# Patient Record
Sex: Female | Born: 1980 | Race: Black or African American | Hispanic: No | Marital: Married | Smoking: Former smoker
Health system: Southern US, Community
[De-identification: ages and names within clinical notes are randomized; demographics above are authoritative.]

## PROBLEM LIST (undated history)

## (undated) ENCOUNTER — Inpatient Hospital Stay (HOSPITAL_COMMUNITY): Payer: Self-pay

## (undated) DIAGNOSIS — D573 Sickle-cell trait: Secondary | ICD-10-CM

## (undated) DIAGNOSIS — N83209 Unspecified ovarian cyst, unspecified side: Secondary | ICD-10-CM

## (undated) HISTORY — PX: INDUCED ABORTION: SHX677

---
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
## 2005-01-29 ENCOUNTER — Inpatient Hospital Stay (HOSPITAL_COMMUNITY): Admission: AD | Admit: 2005-01-29 | Discharge: 2005-01-29 | Payer: Self-pay | Admitting: Obstetrics and Gynecology

## 2005-04-04 ENCOUNTER — Ambulatory Visit (HOSPITAL_COMMUNITY): Admission: RE | Admit: 2005-04-04 | Discharge: 2005-04-04 | Payer: Self-pay | Admitting: Obstetrics & Gynecology

## 2005-08-15 ENCOUNTER — Inpatient Hospital Stay (HOSPITAL_COMMUNITY): Admission: AD | Admit: 2005-08-15 | Discharge: 2005-08-17 | Payer: Self-pay | Admitting: Obstetrics & Gynecology

## 2006-12-02 ENCOUNTER — Emergency Department (HOSPITAL_COMMUNITY): Admission: EM | Admit: 2006-12-02 | Discharge: 2006-12-02 | Payer: Self-pay | Admitting: Family Medicine

## 2011-03-13 LAB — POCT URINALYSIS DIP (DEVICE)
Glucose, UA: NEGATIVE
Ketones, ur: 40 — AB
Nitrite: POSITIVE — AB
Operator id: 116391
Protein, ur: 100 — AB
Specific Gravity, Urine: 1.01
Urobilinogen, UA: 2 — ABNORMAL HIGH
pH: 6

## 2011-03-13 LAB — POCT PREGNANCY, URINE
Operator id: 235561
Preg Test, Ur: NEGATIVE

## 2011-03-13 LAB — URINE CULTURE: Colony Count: >=100000

## 2014-06-22 ENCOUNTER — Emergency Department (HOSPITAL_COMMUNITY)
Admission: EM | Admit: 2014-06-22 | Discharge: 2014-06-22 | Disposition: A | Discharge status: Home / Self Care | Payer: No Typology Code available for payment source | Attending: Emergency Medicine | Admitting: Emergency Medicine

## 2014-06-22 ENCOUNTER — Encounter (HOSPITAL_COMMUNITY): Payer: Self-pay | Admitting: Emergency Medicine

## 2014-06-22 DIAGNOSIS — Y92410 Unspecified street and highway as the place of occurrence of the external cause: Secondary | ICD-10-CM | POA: Diagnosis not present

## 2014-06-22 DIAGNOSIS — S199XXA Unspecified injury of neck, initial encounter: Secondary | ICD-10-CM | POA: Insufficient documentation

## 2014-06-22 DIAGNOSIS — Y998 Other external cause status: Secondary | ICD-10-CM | POA: Insufficient documentation

## 2014-06-22 DIAGNOSIS — M25552 Pain in left hip: Secondary | ICD-10-CM

## 2014-06-22 DIAGNOSIS — S79912A Unspecified injury of left hip, initial encounter: Secondary | ICD-10-CM | POA: Diagnosis present

## 2014-06-22 DIAGNOSIS — Z720 Tobacco use: Secondary | ICD-10-CM | POA: Diagnosis not present

## 2014-06-22 DIAGNOSIS — Z043 Encounter for examination and observation following other accident: Secondary | ICD-10-CM

## 2014-06-22 DIAGNOSIS — S0990XA Unspecified injury of head, initial encounter: Secondary | ICD-10-CM | POA: Insufficient documentation

## 2014-06-22 DIAGNOSIS — Z041 Encounter for examination and observation following transport accident: Secondary | ICD-10-CM

## 2014-06-22 DIAGNOSIS — Y9389 Activity, other specified: Secondary | ICD-10-CM | POA: Insufficient documentation

## 2014-06-22 MED ORDER — NAPROXEN 500 MG PO TABS: 500.0000 mg | ORAL_TABLET | Freq: Two times a day (BID) | ORAL | Status: DC

## 2014-06-22 MED ORDER — CYCLOBENZAPRINE HCL 5 MG PO TABS: 5.0000 mg | ORAL_TABLET | Freq: Three times a day (TID) | ORAL | Status: DC | PRN
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
## 2014-06-22 NOTE — Discharge Instructions (Signed)

## 2015-01-28 ENCOUNTER — Inpatient Hospital Stay (HOSPITAL_COMMUNITY): Payer: Medicaid Other

## 2015-01-28 ENCOUNTER — Encounter (HOSPITAL_COMMUNITY): Payer: Self-pay | Admitting: *Deleted

## 2015-01-28 ENCOUNTER — Inpatient Hospital Stay (HOSPITAL_COMMUNITY)
Admission: AD | Admit: 2015-01-28 | Discharge: 2015-01-28 | Disposition: A | Discharge status: Home / Self Care | Payer: Medicaid Other | Source: Ambulatory Visit | Attending: Obstetrics & Gynecology | Admitting: Obstetrics & Gynecology

## 2015-01-28 DIAGNOSIS — O210 Mild hyperemesis gravidarum: Secondary | ICD-10-CM | POA: Diagnosis not present

## 2015-01-28 DIAGNOSIS — O209 Hemorrhage in early pregnancy, unspecified: Secondary | ICD-10-CM | POA: Insufficient documentation

## 2015-01-28 DIAGNOSIS — O219 Vomiting of pregnancy, unspecified: Secondary | ICD-10-CM

## 2015-01-28 DIAGNOSIS — O4691 Antepartum hemorrhage, unspecified, first trimester: Secondary | ICD-10-CM

## 2015-01-28 DIAGNOSIS — Z3A09 9 weeks gestation of pregnancy: Secondary | ICD-10-CM | POA: Insufficient documentation

## 2015-01-28 DIAGNOSIS — Z87891 Personal history of nicotine dependence: Secondary | ICD-10-CM | POA: Insufficient documentation

## 2015-01-28 DIAGNOSIS — Z3491 Encounter for supervision of normal pregnancy, unspecified, first trimester: Secondary | ICD-10-CM

## 2015-01-28 HISTORY — DX: Unspecified ovarian cyst, unspecified side: N83.209

## 2015-01-28 HISTORY — DX: Sickle-cell trait: D57.3

## 2015-01-28 LAB — URINALYSIS, ROUTINE W REFLEX MICROSCOPIC
Bilirubin Urine: NEGATIVE
Glucose, UA: NEGATIVE mg/dL
Ketones, ur: NEGATIVE mg/dL
LEUKOCYTES UA: NEGATIVE
Nitrite: NEGATIVE
Protein, ur: NEGATIVE mg/dL
SPECIFIC GRAVITY, URINE: 1.01 (ref 1.005–1.030)
Urobilinogen, UA: 0.2 mg/dL (ref 0.0–1.0)
pH: 6.5 (ref 5.0–8.0)

## 2015-01-28 LAB — URINE MICROSCOPIC-ADD ON

## 2015-01-28 LAB — CBC
HCT: 32.1 % — ABNORMAL LOW (ref 36.0–46.0)
Hemoglobin: 11.4 g/dL — ABNORMAL LOW (ref 12.0–15.0)
MCH: 31 pg (ref 26.0–34.0)
MCHC: 35.5 g/dL (ref 30.0–36.0)
MCV: 87.2 fL (ref 78.0–100.0)
Platelets: 290 10*3/uL (ref 150–400)
RBC: 3.68 MIL/uL — ABNORMAL LOW (ref 3.87–5.11)
RDW: 13.7 % (ref 11.5–15.5)
WBC: 13.6 10*3/uL — ABNORMAL HIGH (ref 4.0–10.5)

## 2015-01-28 LAB — WET PREP, GENITAL
CLUE CELLS WET PREP: NONE SEEN
TRICH WET PREP: NONE SEEN
YEAST WET PREP: NONE SEEN

## 2015-01-28 LAB — ABO/RH: ABO/RH(D): A POS

## 2015-01-28 LAB — OB RESULTS CONSOLE GC/CHLAMYDIA: Gonorrhea: NEGATIVE

## 2015-01-28 LAB — POCT PREGNANCY, URINE: PREG TEST UR: POSITIVE — AB

## 2015-01-28 LAB — HCG, QUANTITATIVE, PREGNANCY: hCG, Beta Chain, Quant, S: 191957 m[IU]/mL — ABNORMAL HIGH (ref <5)

## 2015-01-28 MED ORDER — PROMETHAZINE HCL 25 MG PO TABS: 12.5000 mg | ORAL_TABLET | Freq: Four times a day (QID) | ORAL | Status: DC | PRN

## 2015-01-28 NOTE — MAU Provider Note (Signed)
Chief Complaint: Vaginal Bleeding   None     SUBJECTIVE HPI: [name] [name] is a 34 y.o. [id number] at [redacted]w[redacted]d by LMP who presents to maternity admissions reporting onset of bright red bleeding when wiping this afternoon. She reports constipation with some straining this morning but was only urinating when she first saw the bleeding. She is not wearing a pad but put toilet paper into her underwear and came straight to the hospital when she noticed the bleeding.  The blood has not soaked through to her clothes.  She reports nausea/vomiting daily but no other problems so far in this pregnancy.  She has applied for pregnancy Medicaid and is waiting for this to start prenatal care. She denies vaginal itching/burning, urinary symptoms, h/a, dizziness, n/v, or fever/chills.     Vaginal Bleeding The patient's primary symptoms include vaginal bleeding. The patient's pertinent negatives include no pelvic pain or vaginal discharge. This is a new problem. The current episode started today. The problem occurs constantly. The problem has been unchanged. The patient is experiencing no pain. She is pregnant. Associated symptoms include constipation, nausea and vomiting. Pertinent negatives include no abdominal pain, back pain, chills, diarrhea, dysuria, fever, flank pain, frequency, headaches or urgency. The vaginal bleeding is lighter than menses. She has not been passing clots. She has not been passing tissue. The symptoms are aggravated by bowel movements. She has tried nothing for the symptoms. She is sexually active. No, her partner does not have an STD. Her menstrual history has been regular.    Past Medical History  Diagnosis Date  . Sickle cell trait   . Ovarian cyst    Past Surgical History  Procedure Laterality Date  . Induced abortion     Social History   Social History  . Marital Status: Single    Spouse Name: N/A  . Number of Children: N/A  . Years of Education: N/A   Occupational History   . Not on file.   Social History Main Topics  . Smoking status: Former Smoker -- 0.50 packs/day    Types: Cigarettes    Quit date: 10/27/2014  . Smokeless tobacco: Not on file  . Alcohol Use: Yes     Comment: not since pregnancy  . Drug Use: No  . Sexual Activity: Not on file   Other Topics Concern  . Not on file   Social History Narrative   No current facility-administered medications on file prior to encounter.   No current outpatient prescriptions on file prior to encounter.   No Known Allergies  ROS:  Review of Systems  Constitutional: Negative for fever, chills and fatigue.  HENT: Negative for sinus pressure.   Eyes: Negative for photophobia.  Respiratory: Negative for shortness of breath.   Cardiovascular: Negative for chest pain.  Gastrointestinal: Positive for nausea, vomiting and constipation. Negative for abdominal pain and diarrhea.  Genitourinary: Positive for vaginal bleeding. Negative for dysuria, urgency, frequency, flank pain, vaginal discharge, difficulty urinating, vaginal pain and pelvic pain.  Musculoskeletal: Negative for back pain and neck pain.  Neurological: Negative for dizziness, weakness and headaches.  Psychiatric/Behavioral: Negative.      I have reviewed patient's Past Medical Hx, Surgical Hx, Family Hx, Social Hx, medications and allergies.   Physical Exam  No data found.  Constitutional: Well-developed, well-nourished female in no acute distress.  Cardiovascular: normal rate Respiratory: normal effort GI: Abd soft, non-tender. Pos BS x 4 MS: Extremities nontender, no edema, normal ROM Neurologic: Alert and oriented x 4.  GU: Neg CVAT.  PELVIC EXAM: Cervix pink, visually closed, without lesion, scant pink discharge, vaginal walls and external genitalia normal Bimanual exam: Cervix 0/long/high, firm, anterior, neg CMT, uterus nontender, nonenlarged, adnexa without tenderness, enlargement, or mass   LAB RESULTS No results found for  this or any previous visit (from the past 24 hour(s)).  --/--/A POS (09/02 1735)  IMAGING US Ob Comp Less 14 Wks  01/28/2015   CLINICAL DATA:  First-trimester bleeding  EXAM: OBSTETRIC <14 WK ULTRASOUND  TECHNIQUE: Transabdominal ultrasound was performed for evaluation of the gestation as well as the maternal uterus and adnexal regions.  COMPARISON:  None.  FINDINGS: Intrauterine gestational sac: Single  Yolk sac:  Yes  Embryo:  Yes  Cardiac Activity: Yes  Heart Rate: 173 bpm  MSD:   mm    w     d  CRL:   24.4  mm   9 w 1 d                  [place] EDC: 09/01/2015  Maternal uterus/adnexae: Small subchorionic hemorrhage. Unremarkable adnexal regions. No free pelvic fluid.  IMPRESSION: Single living intrauterine gestation measuring 9 weeks 1 day by crown-rump length.   Electronically Signed   By: Ellery Plunk M.D.   On: 01/28/2015 21:53    MAU Management/MDM: Ordered labs and reviewed results. Pt stable at time of discharge.  ASSESSMENT 1. Vaginal bleeding in pregnancy, first trimester   2. Normal IUP (intrauterine pregnancy) on prenatal ultrasound, first trimester   3. Nausea and vomiting during pregnancy prior to [redacted] weeks gestation     PLAN Discharge home with bleeding precautions Discussed US findings and [facility] with pt as likely normal finding.  Pt states understanding. Phenergan 12.5-25 mg PO Q 6 hours PRN nausea Increase fiber and PO fluids for constipation Return to MAU as needed for emergencies   Follow-up Information    Please follow up.   Why:  Start prenatal care as soon as possible      Sharen Counter Certified Nurse-Midwife 01/31/2015  8:47 PM

## 2015-01-28 NOTE — MAU Note (Signed)
Pos HPT x 3 the end of July, has been having N&V.  Has been constipated, strained some with BM this a.m.  Urinated this afternoon & saw blood with wiping.  Denies pain.

## 2015-01-28 NOTE — Discharge Instructions (Signed)
Nausea medication to take during pregnancy:   Unisom (doxylamine succinate 25 mg tablets) Take one tablet daily at bedtime. If symptoms are not adequately controlled, the dose can be increased to a maximum recommended dose of two tablets daily (1/2 tablet in the morning, 1/2 tablet mid-afternoon and one at bedtime).  Vitamin B6  tablets. Take one tablet twice a day (up to 200 mg per day).  Add Phenergan as prescribed to take as needed.   Vaginal Bleeding During Pregnancy, First Trimester A small amount of bleeding (spotting) from the vagina is relatively common in early pregnancy. It usually stops on its own. Various things may cause bleeding or spotting in early pregnancy. Some bleeding may be related to the pregnancy, and some may not. In most cases, the bleeding is normal and is not a problem. However, bleeding can also be a sign of something serious. Be sure to tell your health care provider about any vaginal bleeding right away. Some possible causes of vaginal bleeding during the first trimester include:  Infection or inflammation of the cervix.  Growths (polyps) on the cervix.  Miscarriage or threatened miscarriage.  Pregnancy tissue has developed outside of the uterus and in a fallopian tube (tubal pregnancy).  Tiny cysts have developed in the uterus instead of pregnancy tissue (molar pregnancy). HOME CARE INSTRUCTIONS  Watch your condition for any changes. The following actions may help to lessen any discomfort you are feeling:  Follow your health care provider's instructions for limiting your activity. If your health care provider orders bed rest, you may need to stay in bed and only get up to use the bathroom. However, your health care provider may allow you to continue light activity.  If needed, make plans for someone to help with your regular activities and responsibilities while you are on bed rest.  Keep track of the number of pads you use each day, how often you  change pads, and how soaked (saturated) they are. Write this down.  Do not use tampons. Do not douche.  Do not have sexual intercourse or orgasms until approved by your health care provider.  If you pass any tissue from your vagina, save the tissue so you can show it to your health care provider.  Only take over-the-counter or prescription medicines as directed by your health care provider.  Do not take aspirin because it can make you bleed.  Keep all follow-up appointments as directed by your health care provider. SEEK MEDICAL CARE IF:  You have any vaginal bleeding during any part of your pregnancy.  You have cramps or labor pains.  You have a fever, not controlled by medicine. SEEK IMMEDIATE MEDICAL CARE IF:   You have severe cramps in your back or belly (abdomen).  You pass large clots or tissue from your vagina.  Your bleeding increases.  You feel light-headed or weak, or you have fainting episodes.  You have chills.  You are leaking fluid or have a gush of fluid from your vagina.  You pass out while having a bowel movement. MAKE SURE YOU:  Understand these instructions.  Will watch your condition.  Will get help right away if you are not doing well or get worse. Document Released: 02/21/2005 Document Revised: 05/19/2013 Document Reviewed: 01/19/2013 [facility] Patient Information 2015 [place], [place]. This information is not intended to replace advice given to you by your health care provider. Make sure you discuss any questions you have with your health care provider. Prenatal Care Providers [facility]  [facility]  [phone]     Phone: [phone]          [facility]                      [facility]  @[place]     Phone: [phone]  Phone: [phone]         [facility]     Phone: [phone]  Phone: [phone]           [facility] [place]                hone: [phone]  Phone: [phone]         [facility] [address]o      Phone: [phone]  Phone: [phone]         [facility] Dept.                Phone: [phone]  [facility]   [address] [place])          Phone: [phone] [facility]   Phone: [phone]

## 2015-01-29 LAB — HIV ANTIBODY (ROUTINE TESTING W REFLEX): HIV Screen 4th Generation wRfx: NONREACTIVE

## 2015-02-01 LAB — GC/CHLAMYDIA PROBE AMP (~~LOC~~) NOT AT ARMC
Chlamydia: NEGATIVE
NEISSERIA GONORRHEA: NEGATIVE

## 2015-04-11 ENCOUNTER — Encounter: Payer: Self-pay | Admitting: Obstetrics & Gynecology

## 2015-04-11 ENCOUNTER — Other Ambulatory Visit (HOSPITAL_COMMUNITY)
Admission: RE | Admit: 2015-04-11 | Discharge: 2015-04-11 | Disposition: A | Discharge status: Home / Self Care | Payer: Medicaid Other | Source: Ambulatory Visit | Attending: Obstetrics & Gynecology | Admitting: Obstetrics & Gynecology

## 2015-04-11 ENCOUNTER — Ambulatory Visit (INDEPENDENT_AMBULATORY_CARE_PROVIDER_SITE_OTHER): Payer: Medicaid Other | Admitting: Obstetrics & Gynecology

## 2015-04-11 VITALS — BP 101/63 | HR 83 | Wt 130.0 lb

## 2015-04-11 DIAGNOSIS — O0942 Supervision of pregnancy with grand multiparity, second trimester: Secondary | ICD-10-CM

## 2015-04-11 DIAGNOSIS — Z1151 Encounter for screening for human papillomavirus (HPV): Secondary | ICD-10-CM | POA: Diagnosis not present

## 2015-04-11 DIAGNOSIS — K0889 Other specified disorders of teeth and supporting structures: Secondary | ICD-10-CM

## 2015-04-11 DIAGNOSIS — Z3482 Encounter for supervision of other normal pregnancy, second trimester: Secondary | ICD-10-CM

## 2015-04-11 DIAGNOSIS — Z113 Encounter for screening for infections with a predominantly sexual mode of transmission: Secondary | ICD-10-CM | POA: Diagnosis present

## 2015-04-11 DIAGNOSIS — Z01419 Encounter for gynecological examination (general) (routine) without abnormal findings: Secondary | ICD-10-CM | POA: Insufficient documentation

## 2015-04-11 DIAGNOSIS — Z36 Encounter for antenatal screening of mother: Secondary | ICD-10-CM | POA: Diagnosis not present

## 2015-04-11 DIAGNOSIS — O4702 False labor before 37 completed weeks of gestation, second trimester: Secondary | ICD-10-CM

## 2015-04-11 DIAGNOSIS — O0990 Supervision of high risk pregnancy, unspecified, unspecified trimester: Secondary | ICD-10-CM | POA: Insufficient documentation

## 2015-04-11 NOTE — Addendum Note (Signed)
Addended by: Gita KudoLASSITER, Shatha Hooser S on: 04/11/2015 03:59 PM   Modules accepted: Orders

## 2015-04-11 NOTE — Progress Notes (Signed)
   Subjective:    [name] [name] is a Jeannie FendM AA  [id number] 8951w4d being seen today for her first obstetrical visit.  Her obstetrical history is significant for grandmultipara. Patient does intend to breast feed. Pregnancy history fully reviewed.  Patient reports toothache.  Filed Vitals:   04/11/15 1514  BP: 101/63  Pulse: 83  Weight: 130 lb (58.968 kg)    HISTORY: OB History  Gravida Para Term Preterm AB SAB TAB Ectopic Multiple Living  6 4 4  1     4     # Outcome Date GA Lbr Len/2nd Weight Sex Delivery Anes PTL Lv  6 Current           5 Term 05/05/10   7 lb 8 oz (3.402 kg) F Vag-Spont     4 Term 08/15/05   7 lb 8 oz (3.402 kg) F Vag-Spont     3 Term 08/02/01   7 lb 13 oz (3.544 kg) F Vag-Spont     2 AB 05/2000          1 Term 08/03/99   6 lb 13 oz (3.09 kg) M Vag-Spont        Past Medical History  Diagnosis Date  . Sickle cell trait (HCC)   . Ovarian cyst    Past Surgical History  Procedure Laterality Date  . Induced abortion     Family History  Problem Relation Age of Onset  . Cancer Mother     Breast  . Diabetes Mother      Exam    Uterus:     Pelvic Exam:    Perineum: No Hemorrhoids   Vulva: normal   Vagina:  normal mucosa, normal discharge   pH:    Cervix: anteverted   Adnexa: normal adnexa   Bony Pelvis: android  System: Breast:  normal appearance, no masses or tenderness   Skin: normal coloration and turgor, no rashes    Neurologic: oriented   Extremities: normal strength, tone, and muscle mass   HEENT PERRLA   Mouth/Teeth mucous membranes moist, pharynx normal without lesions   Neck supple   Cardiovascular: regular rate and rhythm   Respiratory:  appears well, vitals normal, no respiratory distress, acyanotic, normal RR, ear and throat exam is normal, neck free of mass or lymphadenopathy, chest clear, no wheezing, crepitations, rhonchi, normal symmetric air entry   Abdomen: soft, non-tender; bowel sounds normal; no masses,  no organomegaly   Urinary: urethral meatus normal      Assessment:    Pregnancy: [id number] Patient Active Problem List   Diagnosis Date Noted  . Encounter for supervision of other normal pregnancy in second trimester 04/11/2015  . Grand multipara in labor in second trimester 04/11/2015        Plan:     Initial labs drawn. Prenatal vitamins. Problem list reviewed and updated. Genetic Screening discussed Quad Screen: declined.  Ultrasound discussed; fetal survey: ordered.  Follow up in 4 weeks. She declines a flu vaccine today.   Atiyana Welte C. 04/11/2015

## 2015-04-12 LAB — PRENATAL PROFILE (SOLSTAS)
Antibody Screen: NEGATIVE
BASOS PCT: 0 % (ref 0–1)
Basophils Absolute: 0 10*3/uL (ref 0.0–0.1)
Eosinophils Absolute: 0.2 10*3/uL (ref 0.0–0.7)
Eosinophils Relative: 1 % (ref 0–5)
HCT: 28.8 % — ABNORMAL LOW (ref 36.0–46.0)
HIV 1&2 Ab, 4th Generation: NONREACTIVE
Hemoglobin: 9.8 g/dL — ABNORMAL LOW (ref 12.0–15.0)
Hepatitis B Surface Ag: NEGATIVE
Lymphocytes Relative: 20 % (ref 12–46)
Lymphs Abs: 3.8 10*3/uL (ref 0.7–4.0)
MCH: 31.5 pg (ref 26.0–34.0)
MCHC: 34 g/dL (ref 30.0–36.0)
MCV: 92.6 fL (ref 78.0–100.0)
MPV: 9.1 fL (ref 8.6–12.4)
Monocytes Absolute: 1.5 10*3/uL — ABNORMAL HIGH (ref 0.1–1.0)
Monocytes Relative: 8 % (ref 3–12)
NEUTROS PCT: 71 % (ref 43–77)
Neutro Abs: 13.3 10*3/uL — ABNORMAL HIGH (ref 1.7–7.7)
Platelets: 307 10*3/uL (ref 150–400)
RBC: 3.11 MIL/uL — ABNORMAL LOW (ref 3.87–5.11)
RDW: 15 % (ref 11.5–15.5)
Rh Type: POSITIVE
Rubella: 17.2 Index — ABNORMAL HIGH (ref <0.90)
WBC: 18.8 10*3/uL — ABNORMAL HIGH (ref 4.0–10.5)

## 2015-04-12 LAB — CULTURE, OB URINE
Colony Count: NO GROWTH
Organism ID, Bacteria: NO GROWTH

## 2015-04-13 ENCOUNTER — Telehealth: Payer: Self-pay

## 2015-04-13 NOTE — Telephone Encounter (Signed)
Patient called and wanted her info sent to another dentist office, one that would take medicaid, [facility]

## 2015-04-14 LAB — CYTOLOGY - PAP

## 2015-04-15 ENCOUNTER — Ambulatory Visit (HOSPITAL_COMMUNITY)
Admission: RE | Admit: 2015-04-15 | Discharge: 2015-04-15 | Disposition: A | Discharge status: Home / Self Care | Payer: Medicaid Other | Source: Ambulatory Visit | Attending: Obstetrics & Gynecology | Admitting: Obstetrics & Gynecology

## 2015-04-15 DIAGNOSIS — Z3A20 20 weeks gestation of pregnancy: Secondary | ICD-10-CM | POA: Diagnosis not present

## 2015-04-15 DIAGNOSIS — Z36 Encounter for antenatal screening of mother: Secondary | ICD-10-CM | POA: Insufficient documentation

## 2015-04-15 DIAGNOSIS — Z3482 Encounter for supervision of other normal pregnancy, second trimester: Secondary | ICD-10-CM

## 2015-04-18 ENCOUNTER — Encounter: Payer: Self-pay | Admitting: *Deleted

## 2015-04-28 ENCOUNTER — Encounter: Payer: Self-pay | Admitting: *Deleted

## 2015-05-11 ENCOUNTER — Ambulatory Visit (INDEPENDENT_AMBULATORY_CARE_PROVIDER_SITE_OTHER): Payer: Medicaid Other | Admitting: Family Medicine

## 2015-05-11 VITALS — BP 99/64 | HR 82 | Wt 133.0 lb

## 2015-05-11 DIAGNOSIS — Z3492 Encounter for supervision of normal pregnancy, unspecified, second trimester: Secondary | ICD-10-CM

## 2015-05-11 DIAGNOSIS — Z3482 Encounter for supervision of other normal pregnancy, second trimester: Secondary | ICD-10-CM

## 2015-05-11 NOTE — Progress Notes (Signed)
Subjective:  [name] [name] is a 34 y.o. [phone] [id number] at 6880w6d being seen today for ongoing prenatal care.  She is currently monitored for the following issues for this low-risk pregnancy and has Supervision of normal pregnancy, antepartum on her problem list.  Patient reports no complaints.  Contractions: Not present. Vag. Bleeding: None.  Movement: Present. Denies leaking of fluid.   The following portions of the patient's history were reviewed and updated as appropriate: allergies, current medications, past family history, past medical history, past social history, past surgical history and problem list. Problem list updated.  Objective:   Filed Vitals:   05/11/15 1021  BP: 99/64  Pulse: 82  Weight: 133 lb (60.328 kg)    Fetal Status: Fetal Heart Rate (bpm): 161   Movement: Present     General:  Alert, oriented and cooperative. Patient is in no acute distress.  Skin: Skin is warm and dry. No rash noted.   Cardiovascular: Normal heart rate noted  Respiratory: Normal respiratory effort, no problems with respiration noted  Abdomen: Soft, gravid, appropriate for gestational age. Pain/Pressure: Present     Pelvic: Vag. Bleeding: None Vag D/C Character: Thin   Cervical exam deferred        Extremities: Normal range of motion.  Edema: Trace  Mental Status: Normal mood and affect. Normal behavior. Normal judgment and thought content.     Assessment and Plan:  Pregnancy: [id number] at 1780w6d  1. Supervision of normal pregnancy, second trimester Continue routine prenatal care.   Preterm labor symptoms and general obstetric precautions including but not limited to vaginal bleeding, contractions, leaking of fluid and fetal movement were reviewed in detail with the patient. Please refer to After Visit Summary for other counseling recommendations.  Return in 4 weeks (on 06/08/2015).   Reva Boresanya S Carmon Sahli, MD

## 2015-05-11 NOTE — Patient Instructions (Signed)
Contraception Choices Contraception (birth control) is the use of any methods or devices to prevent pregnancy. Below are some methods to help avoid pregnancy. HORMONAL METHODS   Contraceptive implant. This is a thin, plastic tube containing progesterone hormone. It does not contain estrogen hormone. Your health care provider inserts the tube in the inner part of the upper arm. The tube can remain in place for up to 3 years. After 3 years, the implant must be removed. The implant prevents the ovaries from releasing an egg (ovulation), thickens the cervical mucus to prevent sperm from entering the uterus, and thins the lining of the inside of the uterus.  Progesterone-only injections. These injections are given every 3 months by your health care provider to prevent pregnancy. This synthetic progesterone hormone stops the ovaries from releasing eggs. It also thickens cervical mucus and changes the uterine lining. This makes it harder for sperm to survive in the uterus.  Birth control pills. These pills contain estrogen and progesterone hormone. They work by preventing the ovaries from releasing eggs (ovulation). They also cause the cervical mucus to thicken, preventing the sperm from entering the uterus. Birth control pills are prescribed by a health care provider.Birth control pills can also be used to treat heavy periods.  Minipill. This type of birth control pill contains only the progesterone hormone. They are taken every day of each month and must be prescribed by your health care provider.  Birth control patch. The patch contains hormones similar to those in birth control pills. It must be changed once a week and is prescribed by a health care provider.  Vaginal ring. The ring contains hormones similar to those in birth control pills. It is left in the vagina for 3 weeks, removed for 1 week, and then a new one is put back in place. The patient must be comfortable inserting and removing the ring  from the vagina.A health care provider's prescription is necessary.  Emergency contraception. Emergency contraceptives prevent pregnancy after unprotected sexual intercourse. This pill can be taken right after sex or up to 5 days after unprotected sex. It is most effective the sooner you take the pills after having sexual intercourse. Most emergency contraceptive pills are available without a prescription. Check with your pharmacist. Do not use emergency contraception as your only form of birth control. BARRIER METHODS   Female condom. This is a thin sheath (latex or rubber) that is worn over the penis during sexual intercourse. It can be used with spermicide to increase effectiveness.  Female condom. This is a soft, loose-fitting sheath that is put into the vagina before sexual intercourse.  Diaphragm. This is a soft, latex, dome-shaped barrier that must be fitted by a health care provider. It is inserted into the vagina, along with a spermicidal jelly. It is inserted before intercourse. The diaphragm should be left in the vagina for 6 to 8 hours after intercourse.  Cervical cap. This is a round, soft, latex or plastic cup that fits over the cervix and must be fitted by a health care provider. The cap can be left in place for up to 48 hours after intercourse.  Sponge. This is a soft, circular piece of polyurethane foam. The sponge has spermicide in it. It is inserted into the vagina after wetting it and before sexual intercourse.  Spermicides. These are chemicals that kill or block sperm from entering the cervix and uterus. They come in the form of creams, jellies, suppositories, foam, or tablets. They do not require a   prescription. They are inserted into the vagina with an applicator before having sexual intercourse. The process must be repeated every time you have sexual intercourse. INTRAUTERINE CONTRACEPTION  Intrauterine device (IUD). This is a T-shaped device that is put in a woman's uterus  during a menstrual period to prevent pregnancy. There are 2 types:  Copper IUD. This type of IUD is wrapped in copper wire and is placed inside the uterus. Copper makes the uterus and fallopian tubes produce a fluid that kills sperm. It can stay in place for 10 years.  Hormone IUD. This type of IUD contains the hormone progestin (synthetic progesterone). The hormone thickens the cervical mucus and prevents sperm from entering the uterus, and it also thins the uterine lining to prevent implantation of a fertilized egg. The hormone can weaken or kill the sperm that get into the uterus. It can stay in place for 3-5 years, depending on which type of IUD is used. PERMANENT METHODS OF CONTRACEPTION  Female tubal ligation. This is when the woman's fallopian tubes are surgically sealed, tied, or blocked to prevent the egg from traveling to the uterus.  Hysteroscopic sterilization. This involves placing a small coil or insert into each fallopian tube. Your doctor uses a technique called hysteroscopy to do the procedure. The device causes scar tissue to form. This results in permanent blockage of the fallopian tubes, so the sperm cannot fertilize the egg. It takes about 3 months after the procedure for the tubes to become blocked. You must use another form of birth control for these 3 months.  Female sterilization. This is when the female has the tubes that carry sperm tied off (vasectomy).This blocks sperm from entering the vagina during sexual intercourse. After the procedure, the man can still ejaculate fluid (semen). NATURAL PLANNING METHODS  Natural family planning. This is not having sexual intercourse or using a barrier method (condom, diaphragm, cervical cap) on days the woman could become pregnant.  Calendar method. This is keeping track of the length of each menstrual cycle and identifying when you are fertile.  Ovulation method. This is avoiding sexual intercourse during ovulation.  Symptothermal  method. This is avoiding sexual intercourse during ovulation, using a thermometer and ovulation symptoms.  Post-ovulation method. This is timing sexual intercourse after you have ovulated. Regardless of which type or method of contraception you choose, it is important that you use condoms to protect against the transmission of sexually transmitted infections (STIs). Talk with your health care provider about which form of contraception is most appropriate for you.   This information is not intended to replace advice given to you by your health care provider. Make sure you discuss any questions you have with your health care provider.   Document Released: 05/14/2005 Document Revised: 05/19/2013 Document Reviewed: 11/06/2012 Elsevier Interactive Patient Education 2016 [employer]  Breastfeeding Deciding to breastfeed is one of the best choices you can make for you and your baby. A change in hormones during pregnancy causes your breast tissue to grow and increases the number and size of your milk ducts. These hormones also allow proteins, sugars, and fats from your blood supply to make breast milk in your milk-producing glands. Hormones prevent breast milk from being released before your baby is born as well as prompt milk flow after birth. Once breastfeeding has begun, thoughts of your baby, as well as his or her sucking or crying, can stimulate the release of milk from your milk-producing glands.  BENEFITS OF BREASTFEEDING For Your Baby    Your first milk (colostrum) helps your baby's digestive system function better.  There are antibodies in your milk that help your baby fight off infections.  Your baby has a lower incidence of asthma, allergies, and sudden infant death syndrome.  The nutrients in breast milk are better for your baby than infant formulas and are designed uniquely for your baby's needs.  Breast milk improves your baby's brain development.  Your baby is less likely to develop  other conditions, such as childhood obesity, asthma, or type 2 diabetes mellitus. For You  Breastfeeding helps to create a very special bond between you and your baby.  Breastfeeding is convenient. Breast milk is always available at the correct temperature and costs nothing.  Breastfeeding helps to burn calories and helps you lose the weight gained during pregnancy.  Breastfeeding makes your uterus contract to its prepregnancy size faster and slows bleeding (lochia) after you give birth.   Breastfeeding helps to lower your risk of developing type 2 diabetes mellitus, osteoporosis, and breast or ovarian cancer later in life. SIGNS THAT YOUR BABY IS HUNGRY Early Signs of Hunger  Increased alertness or activity.  Stretching.  Movement of the head from side to side.  Movement of the head and opening of the mouth when the corner of the mouth or cheek is stroked (rooting).  Increased sucking sounds, smacking lips, cooing, sighing, or squeaking.  Hand-to-mouth movements.  Increased sucking of fingers or hands. Late Signs of Hunger  Fussing.  Intermittent crying. Extreme Signs of Hunger Signs of extreme hunger will require calming and consoling before your baby will be able to breastfeed successfully. Do not wait for the following signs of extreme hunger to occur before you initiate breastfeeding:  Restlessness.  A loud, strong cry.  Screaming. BREASTFEEDING BASICS Breastfeeding Initiation  Find a comfortable place to sit or lie down, with your neck and back well supported.  Place a pillow or rolled up blanket under your baby to bring him or her to the level of your breast (if you are seated). Nursing pillows are specially designed to help support your arms and your baby while you breastfeed.  Make sure that your baby's abdomen is facing your abdomen.  Gently massage your breast. With your fingertips, massage from your chest wall toward your nipple in a circular motion.  This encourages milk flow. You may need to continue this action during the feeding if your milk flows slowly.  Support your breast with 4 fingers underneath and your thumb above your nipple. Make sure your fingers are well away from your nipple and your baby's mouth.  Stroke your baby's lips gently with your finger or nipple.  When your baby's mouth is open wide enough, quickly bring your baby to your breast, placing your entire nipple and as much of the colored area around your nipple (areola) as possible into your baby's mouth.  More areola should be visible above your baby's upper lip than below the lower lip.  Your baby's tongue should be between his or her lower gum and your breast.  Ensure that your baby's mouth is correctly positioned around your nipple (latched). Your baby's lips should create a seal on your breast and be turned out (everted).  It is common for your baby to suck about 2-3 minutes in order to start the flow of breast milk. Latching Teaching your baby how to latch on to your breast properly is very important. An improper latch can cause nipple pain and decreased milk supply for   you and poor weight gain in your baby. Also, if your baby is not latched onto your nipple properly, he or she may swallow some air during feeding. This can make your baby fussy. Burping your baby when you switch breasts during the feeding can help to get rid of the air. However, teaching your baby to latch on properly is still the best way to prevent fussiness from swallowing air while breastfeeding. Signs that your baby has successfully latched on to your nipple:  Silent tugging or silent sucking, without causing you pain.  Swallowing heard between every 3-4 sucks.  Muscle movement above and in front of his or her ears while sucking. Signs that your baby has not successfully latched on to nipple:  Sucking sounds or smacking sounds from your baby while breastfeeding.  Nipple pain. If you  think your baby has not latched on correctly, slip your finger into the corner of your baby's mouth to break the suction and place it between your baby's gums. Attempt breastfeeding initiation again. Signs of Successful Breastfeeding Signs from your baby:  A gradual decrease in the number of sucks or complete cessation of sucking.  Falling asleep.  Relaxation of his or her body.  Retention of a small amount of milk in his or her mouth.  Letting go of your breast by himself or herself. Signs from you:  Breasts that have increased in firmness, weight, and size 1-3 hours after feeding.  Breasts that are softer immediately after breastfeeding.  Increased milk volume, as well as a change in milk consistency and color by the fifth day of breastfeeding.  Nipples that are not sore, cracked, or bleeding. Signs That Your Baby is Getting Enough Milk  Wetting at least 3 diapers in a 24-hour period. The urine should be clear and pale yellow by age 5 days.  At least 3 stools in a 24-hour period by age 5 days. The stool should be soft and yellow.  At least 3 stools in a 24-hour period by age 7 days. The stool should be seedy and yellow.  No loss of weight greater than 10% of birth weight during the first 3 days of age.  Average weight gain of 4-7 ounces (113-198 g) per week after age 4 days.  Consistent daily weight gain by age 5 days, without weight loss after the age of 2 weeks. After a feeding, your baby may spit up a small amount. This is common. BREASTFEEDING FREQUENCY AND DURATION Frequent feeding will help you make more milk and can prevent sore nipples and breast engorgement. Breastfeed when you feel the need to reduce the fullness of your breasts or when your baby shows signs of hunger. This is called "breastfeeding on demand." Avoid introducing a pacifier to your baby while you are working to establish breastfeeding (the first 4-6 weeks after your baby is born). After this time you  may choose to use a pacifier. Research has shown that pacifier use during the first year of a baby's life decreases the risk of sudden infant death syndrome (SIDS). Allow your baby to feed on each breast as long as he or she wants. Breastfeed until your baby is finished feeding. When your baby unlatches or falls asleep while feeding from the first breast, offer the second breast. Because newborns are often sleepy in the first few weeks of life, you may need to awaken your baby to get him or her to feed. Breastfeeding times will vary from baby to baby. However, the following   rules can serve as a guide to help you ensure that your baby is properly fed:  Newborns (babies 4 weeks of age or younger) may breastfeed every 1-3 hours.  Newborns should not go longer than 3 hours during the day or 5 hours during the night without breastfeeding.  You should breastfeed your baby a minimum of 8 times in a 24-hour period until you begin to introduce solid foods to your baby at around 6 months of age. BREAST MILK PUMPING Pumping and storing breast milk allows you to ensure that your baby is exclusively fed your breast milk, even at times when you are unable to breastfeed. This is especially important if you are going back to work while you are still breastfeeding or when you are not able to be present during feedings. Your lactation consultant can give you guidelines on how long it is safe to store breast milk. A breast pump is a machine that allows you to pump milk from your breast into a sterile bottle. The pumped breast milk can then be stored in a refrigerator or freezer. Some breast pumps are operated by hand, while others use electricity. Ask your lactation consultant which type will work best for you. Breast pumps can be purchased, but some hospitals and breastfeeding support groups lease breast pumps on a monthly basis. A lactation consultant can teach you how to hand express breast milk, if you prefer not to use  a pump. CARING FOR YOUR BREASTS WHILE YOU BREASTFEED Nipples can become dry, cracked, and sore while breastfeeding. The following recommendations can help keep your breasts moisturized and healthy:  Avoid using soap on your nipples.  Wear a supportive bra. Although not required, special nursing bras and tank tops are designed to allow access to your breasts for breastfeeding without taking off your entire bra or top. Avoid wearing underwire-style bras or extremely tight bras.  Air dry your nipples for 3-4minutes after each feeding.  Use only cotton bra pads to absorb leaked breast milk. Leaking of breast milk between feedings is normal.  Use lanolin on your nipples after breastfeeding. Lanolin helps to maintain your skin's normal moisture barrier. If you use pure lanolin, you do not need to wash it off before feeding your baby again. Pure lanolin is not toxic to your baby. You may also hand express a few drops of breast milk and gently massage that milk into your nipples and allow the milk to air dry. In the first few weeks after giving birth, some women experience extremely full breasts (engorgement). Engorgement can make your breasts feel heavy, warm, and tender to the touch. Engorgement peaks within 3-5 days after you give birth. The following recommendations can help ease engorgement:  Completely empty your breasts while breastfeeding or pumping. You may want to start by applying warm, moist heat (in the shower or with warm water-soaked hand towels) just before feeding or pumping. This increases circulation and helps the milk flow. If your baby does not completely empty your breasts while breastfeeding, pump any extra milk after he or she is finished.  Wear a snug bra (nursing or regular) or tank top for 1-2 days to signal your body to slightly decrease milk production.  Apply ice packs to your breasts, unless this is too uncomfortable for you.  Make sure that your baby is latched on and  positioned properly while breastfeeding. If engorgement persists after 48 hours of following these recommendations, contact your health care provider or a lactation consultant. OVERALL HEALTH   CARE RECOMMENDATIONS WHILE BREASTFEEDING  Eat healthy foods. Alternate between meals and snacks, eating 3 of each per day. Because what you eat affects your breast milk, some of the foods may make your baby more irritable than usual. Avoid eating these foods if you are sure that they are negatively affecting your baby.  Drink milk, fruit juice, and water to satisfy your thirst (about 10 glasses a day).  Rest often, relax, and continue to take your prenatal vitamins to prevent fatigue, stress, and anemia.  Continue breast self-awareness checks.  Avoid chewing and smoking tobacco. Chemicals from cigarettes that pass into breast milk and exposure to secondhand smoke may harm your baby.  Avoid alcohol and drug use, including marijuana. Some medicines that may be harmful to your baby can pass through breast milk. It is important to ask your health care provider before taking any medicine, including all over-the-counter and prescription medicine as well as vitamin and herbal supplements. It is possible to become pregnant while breastfeeding. If birth control is desired, ask your health care provider about options that will be safe for your baby. SEEK MEDICAL CARE IF:  You feel like you want to stop breastfeeding or have become frustrated with breastfeeding.  You have painful breasts or nipples.  Your nipples are cracked or bleeding.  Your breasts are red, tender, or warm.  You have a swollen area on either breast.  You have a fever or chills.  You have nausea or vomiting.  You have drainage other than breast milk from your nipples.  Your breasts do not become full before feedings by the fifth day after you give birth.  You feel sad and depressed.  Your baby is too sleepy to eat well.  Your  baby is having trouble sleeping.   Your baby is wetting less than 3 diapers in a 24-hour period.  Your baby has less than 3 stools in a 24-hour period.  Your baby's skin or the white part of his or her eyes becomes yellow.   Your baby is not gaining weight by 5 days of age. SEEK IMMEDIATE MEDICAL CARE IF:  Your baby is overly tired (lethargic) and does not want to wake up and feed.  Your baby develops an unexplained fever.   This information is not intended to replace advice given to you by your health care provider. Make sure you discuss any questions you have with your health care provider.   Document Released: 05/14/2005 Document Revised: 02/02/2015 Document Reviewed: 11/05/2012 Elsevier Interactive Patient Education 2016 [employer]  

## 2015-05-29 NOTE — L&D Delivery Note (Signed)
Delivery Note  Patient presented for IOL for ghtn, then found to have preeclampsia w/ severe features. Augmented with cytotec, foley, pitocin, and arom. Rapid progression after AROM.  At 4:23 PM a viable female was delivered via Vaginal, Spontaneous Delivery (Presentation: Left Occiput Anterior).  APGAR: 8, 9; weight 2695 g.   Placenta status: Intact, Spontaneous.  Cord: 3 vessels with the following complications: None.  Cord pH: not obtained  Anesthesia: None  Episiotomy: None Lacerations: None Est. Blood Loss (mL):  200  Mom to postpartum.  Baby to Couplet care / Skin to Skin.  [name] [name] 08/12/2015, 5:31 PM

## 2015-06-08 ENCOUNTER — Encounter: Payer: Medicaid Other | Admitting: Certified Nurse Midwife

## 2015-06-10 ENCOUNTER — Ambulatory Visit (INDEPENDENT_AMBULATORY_CARE_PROVIDER_SITE_OTHER): Payer: Medicaid Other | Admitting: Family Medicine

## 2015-06-10 VITALS — BP 108/68 | HR 99 | Wt 148.0 lb

## 2015-06-10 DIAGNOSIS — Z36 Encounter for antenatal screening of mother: Secondary | ICD-10-CM

## 2015-06-10 DIAGNOSIS — Z3483 Encounter for supervision of other normal pregnancy, third trimester: Secondary | ICD-10-CM

## 2015-06-10 LAB — CBC
HEMATOCRIT: 26 % — AB (ref 36.0–46.0)
HEMOGLOBIN: 8.8 g/dL — AB (ref 12.0–15.0)
MCH: 31.5 pg (ref 26.0–34.0)
MCHC: 33.8 g/dL (ref 30.0–36.0)
MCV: 93.2 fL (ref 78.0–100.0)
MPV: 8.3 fL — AB (ref 8.6–12.4)
PLATELETS: 271 10*3/uL (ref 150–400)
RBC: 2.79 MIL/uL — AB (ref 3.87–5.11)
RDW: 13.7 % (ref 11.5–15.5)
WBC: 14.3 10*3/uL — ABNORMAL HIGH (ref 4.0–10.5)

## 2015-06-10 MED ORDER — TETANUS-DIPHTH-ACELL PERTUSSIS 5-2.5-18.5 LF-MCG/0.5 IM SUSP: 0.5000 mL | Freq: Once | INTRAMUSCULAR | Status: DC

## 2015-06-10 NOTE — Progress Notes (Signed)
Subjective:  [name] DickensRayshanda D Brightbill is a 35 y.o. [id number] at 4414w1d being seen today for ongoing prenatal care.  She is currently monitored for the following issues for this low-risk pregnancy and has Supervision of normal pregnancy, antepartum on her problem list.  Patient reports no complaints.  Contractions: Not present. Vag. Bleeding: None.  Movement: Present. Denies leaking of fluid.   The following portions of the patient's history were reviewed and updated as appropriate: allergies, current medications, past family history, past medical history, past social history, past surgical history and problem list. Problem list updated.  Objective:   Filed Vitals:   06/10/15 0825  BP: 108/68  Pulse: 99  Weight: 148 lb (67.132 kg)    Fetal Status: Fetal Heart Rate (bpm): 149   Movement: Present     General:  Alert, oriented and cooperative. Patient is in no acute distress.  Skin: Skin is warm and dry. No rash noted.   Cardiovascular: Normal heart rate noted  Respiratory: Normal respiratory effort, no problems with respiration noted  Abdomen: Soft, gravid, appropriate for gestational age. Pain/Pressure: Present     Pelvic: Vag. Bleeding: None Vag D/C Character: Thin   Cervical exam deferred        Extremities: Normal range of motion.  Edema: Trace  Mental Status: Normal mood and affect. Normal behavior. Normal judgment and thought content.   Urinalysis: Urine Protein: Negative Urine Glucose: Negative  Assessment and Plan:  Pregnancy: [id number] at 2714w1d  1. Supervision of normal pregnancy, antepartum, third trimester Continue routine prenatal care. 28 wk labs today - CBC - RPR - HIV antibody - Glucose Tolerance, 1 HR (50g) Declines TDaP  Preterm labor symptoms and general obstetric precautions including but not limited to vaginal bleeding, contractions, leaking of fluid and fetal movement were reviewed in detail with the patient. Please refer to After Visit Summary for other counseling  recommendations.  Return in 2 weeks (on 06/24/2015).   Reva Boresanya S Temara Lanum, MD

## 2015-06-10 NOTE — Patient Instructions (Signed)
Breastfeeding Deciding to breastfeed is one of the best choices you can make for you and your baby. A change in hormones during pregnancy causes your breast tissue to grow and increases the number and size of your milk ducts. These hormones also allow proteins, sugars, and fats from your blood supply to make breast milk in your milk-producing glands. Hormones prevent breast milk from being released before your baby is born as well as prompt milk flow after birth. Once breastfeeding has begun, thoughts of your baby, as well as his or her sucking or crying, can stimulate the release of milk from your milk-producing glands.  BENEFITS OF BREASTFEEDING For Your Baby  Your first milk (colostrum) helps your baby's digestive system function better.  There are antibodies in your milk that help your baby fight off infections.  Your baby has a lower incidence of asthma, allergies, and sudden infant death syndrome.  The nutrients in breast milk are better for your baby than infant formulas and are designed uniquely for your baby's needs.  Breast milk improves your baby's brain development.  Your baby is less likely to develop other conditions, such as childhood obesity, asthma, or type 2 diabetes mellitus. For You  Breastfeeding helps to create a very special bond between you and your baby.  Breastfeeding is convenient. Breast milk is always available at the correct temperature and costs nothing.  Breastfeeding helps to burn calories and helps you lose the weight gained during pregnancy.  Breastfeeding makes your uterus contract to its prepregnancy size faster and slows bleeding (lochia) after you give birth.   Breastfeeding helps to lower your risk of developing type 2 diabetes mellitus, osteoporosis, and breast or ovarian cancer later in life. SIGNS THAT YOUR BABY IS HUNGRY Early Signs of Hunger  Increased alertness or activity.  Stretching.  Movement of the head from side to  side.  Movement of the head and opening of the mouth when the corner of the mouth or cheek is stroked (rooting).  Increased sucking sounds, smacking lips, cooing, sighing, or squeaking.  Hand-to-mouth movements.  Increased sucking of fingers or hands. Late Signs of Hunger  Fussing.  Intermittent crying. Extreme Signs of Hunger Signs of extreme hunger will require calming and consoling before your baby will be able to breastfeed successfully. Do not wait for the following signs of extreme hunger to occur before you initiate breastfeeding:  Restlessness.  A loud, strong cry.  Screaming. BREASTFEEDING BASICS Breastfeeding Initiation  Find a comfortable place to sit or lie down, with your neck and back well supported.  Place a pillow or rolled up blanket under your baby to bring him or her to the level of your breast (if you are seated). Nursing pillows are specially designed to help support your arms and your baby while you breastfeed.  Make sure that your baby's abdomen is facing your abdomen.  Gently massage your breast. With your fingertips, massage from your chest wall toward your nipple in a circular motion. This encourages milk flow. You may need to continue this action during the feeding if your milk flows slowly.  Support your breast with 4 fingers underneath and your thumb above your nipple. Make sure your fingers are well away from your nipple and your baby's mouth.  Stroke your baby's lips gently with your finger or nipple.  When your baby's mouth is open wide enough, quickly bring your baby to your breast, placing your entire nipple and as much of the colored area around your nipple (  areola) as possible into your baby's mouth.  More areola should be visible above your baby's upper lip than below the lower lip.  Your baby's tongue should be between his or her lower gum and your breast.  Ensure that your baby's mouth is correctly positioned around your nipple  (latched). Your baby's lips should create a seal on your breast and be turned out (everted).  It is common for your baby to suck about 2-3 minutes in order to start the flow of breast milk. Latching Teaching your baby how to latch on to your breast properly is very important. An improper latch can cause nipple pain and decreased milk supply for you and poor weight gain in your baby. Also, if your baby is not latched onto your nipple properly, he or she may swallow some air during feeding. This can make your baby fussy. Burping your baby when you switch breasts during the feeding can help to get rid of the air. However, teaching your baby to latch on properly is still the best way to prevent fussiness from swallowing air while breastfeeding. Signs that your baby has successfully latched on to your nipple:  Silent tugging or silent sucking, without causing you pain.  Swallowing heard between every 3-4 sucks.  Muscle movement above and in front of his or her ears while sucking. Signs that your baby has not successfully latched on to nipple:  Sucking sounds or smacking sounds from your baby while breastfeeding.  Nipple pain. If you think your baby has not latched on correctly, slip your finger into the corner of your baby's mouth to break the suction and place it between your baby's gums. Attempt breastfeeding initiation again. Signs of Successful Breastfeeding Signs from your baby:  A gradual decrease in the number of sucks or complete cessation of sucking.  Falling asleep.  Relaxation of his or her body.  Retention of a small amount of milk in his or her mouth.  Letting go of your breast by himself or herself. Signs from you:  Breasts that have increased in firmness, weight, and size 1-3 hours after feeding.  Breasts that are softer immediately after breastfeeding.  Increased milk volume, as well as a change in milk consistency and color by the fifth day of breastfeeding.  Nipples  that are not sore, cracked, or bleeding. Signs That Your Baby is Getting Enough Milk  Wetting at least 3 diapers in a 24-hour period. The urine should be clear and pale yellow by age 5 days.  At least 3 stools in a 24-hour period by age 5 days. The stool should be soft and yellow.  At least 3 stools in a 24-hour period by age 7 days. The stool should be seedy and yellow.  No loss of weight greater than 10% of birth weight during the first 3 days of age.  Average weight gain of 4-7 ounces (113-198 g) per week after age 4 days.  Consistent daily weight gain by age 5 days, without weight loss after the age of 2 weeks. After a feeding, your baby may spit up a small amount. This is common. BREASTFEEDING FREQUENCY AND DURATION Frequent feeding will help you make more milk and can prevent sore nipples and breast engorgement. Breastfeed when you feel the need to reduce the fullness of your breasts or when your baby shows signs of hunger. This is called "breastfeeding on demand." Avoid introducing a pacifier to your baby while you are working to establish breastfeeding (the first 4-6 weeks   after your baby is born). After this time you may choose to use a pacifier. Research has shown that pacifier use during the first year of a baby's life decreases the risk of sudden infant death syndrome (SIDS). Allow your baby to feed on each breast as long as he or she wants. Breastfeed until your baby is finished feeding. When your baby unlatches or falls asleep while feeding from the first breast, offer the second breast. Because newborns are often sleepy in the first few weeks of life, you may need to awaken your baby to get him or her to feed. Breastfeeding times will vary from baby to baby. However, the following rules can serve as a guide to help you ensure that your baby is properly fed:  Newborns (babies 4 weeks of age or younger) may breastfeed every 1-3 hours.  Newborns should not go longer than 3 hours  during the day or 5 hours during the night without breastfeeding.  You should breastfeed your baby a minimum of 8 times in a 24-hour period until you begin to introduce solid foods to your baby at around 6 months of age. BREAST MILK PUMPING Pumping and storing breast milk allows you to ensure that your baby is exclusively fed your breast milk, even at times when you are unable to breastfeed. This is especially important if you are going back to work while you are still breastfeeding or when you are not able to be present during feedings. Your lactation consultant can give you guidelines on how long it is safe to store breast milk. A breast pump is a machine that allows you to pump milk from your breast into a sterile bottle. The pumped breast milk can then be stored in a refrigerator or freezer. Some breast pumps are operated by hand, while others use electricity. Ask your lactation consultant which type will work best for you. Breast pumps can be purchased, but some hospitals and breastfeeding support groups lease breast pumps on a monthly basis. A lactation consultant can teach you how to hand express breast milk, if you prefer not to use a pump. CARING FOR YOUR BREASTS WHILE YOU BREASTFEED Nipples can become dry, cracked, and sore while breastfeeding. The following recommendations can help keep your breasts moisturized and healthy:  Avoid using soap on your nipples.  Wear a supportive bra. Although not required, special nursing bras and tank tops are designed to allow access to your breasts for breastfeeding without taking off your entire bra or top. Avoid wearing underwire-style bras or extremely tight bras.  Air dry your nipples for 3-4minutes after each feeding.  Use only cotton bra pads to absorb leaked breast milk. Leaking of breast milk between feedings is normal.  Use lanolin on your nipples after breastfeeding. Lanolin helps to maintain your skin's normal moisture barrier. If you use  pure lanolin, you do not need to wash it off before feeding your baby again. Pure lanolin is not toxic to your baby. You may also hand express a few drops of breast milk and gently massage that milk into your nipples and allow the milk to air dry. In the first few weeks after giving birth, some women experience extremely full breasts (engorgement). Engorgement can make your breasts feel heavy, warm, and tender to the touch. Engorgement peaks within 3-5 days after you give birth. The following recommendations can help ease engorgement:  Completely empty your breasts while breastfeeding or pumping. You may want to start by applying warm, moist heat (in   the shower or with warm water-soaked hand towels) just before feeding or pumping. This increases circulation and helps the milk flow. If your baby does not completely empty your breasts while breastfeeding, pump any extra milk after he or she is finished.  Wear a snug bra (nursing or regular) or tank top for 1-2 days to signal your body to slightly decrease milk production.  Apply ice packs to your breasts, unless this is too uncomfortable for you.  Make sure that your baby is latched on and positioned properly while breastfeeding. If engorgement persists after 48 hours of following these recommendations, contact your health care provider or a lactation consultant. OVERALL HEALTH CARE RECOMMENDATIONS WHILE BREASTFEEDING  Eat healthy foods. Alternate between meals and snacks, eating 3 of each per day. Because what you eat affects your breast milk, some of the foods may make your baby more irritable than usual. Avoid eating these foods if you are sure that they are negatively affecting your baby.  Drink milk, fruit juice, and water to satisfy your thirst (about 10 glasses a day).  Rest often, relax, and continue to take your prenatal vitamins to prevent fatigue, stress, and anemia.  Continue breast self-awareness checks.  Avoid chewing and smoking  tobacco. Chemicals from cigarettes that pass into breast milk and exposure to secondhand smoke may harm your baby.  Avoid alcohol and drug use, including marijuana. Some medicines that may be harmful to your baby can pass through breast milk. It is important to ask your health care provider before taking any medicine, including all over-the-counter and prescription medicine as well as vitamin and herbal supplements. It is possible to become pregnant while breastfeeding. If birth control is desired, ask your health care provider about options that will be safe for your baby. SEEK MEDICAL CARE IF:  You feel like you want to stop breastfeeding or have become frustrated with breastfeeding.  You have painful breasts or nipples.  Your nipples are cracked or bleeding.  Your breasts are red, tender, or warm.  You have a swollen area on either breast.  You have a fever or chills.  You have nausea or vomiting.  You have drainage other than breast milk from your nipples.  Your breasts do not become full before feedings by the fifth day after you give birth.  You feel sad and depressed.  Your baby is too sleepy to eat well.  Your baby is having trouble sleeping.   Your baby is wetting less than 3 diapers in a 24-hour period.  Your baby has less than 3 stools in a 24-hour period.  Your baby's skin or the white part of his or her eyes becomes yellow.   Your baby is not gaining weight by 5 days of age. SEEK IMMEDIATE MEDICAL CARE IF:  Your baby is overly tired (lethargic) and does not want to wake up and feed.  Your baby develops an unexplained fever.   This information is not intended to replace advice given to you by your health care provider. Make sure you discuss any questions you have with your health care provider.   Document Released: 05/14/2005 Document Revised: 02/02/2015 Document Reviewed: 11/05/2012 Elsevier Interactive Patient Education 2016 [employer]  Preterm  Labor Information Preterm labor is when labor starts at less than 37 weeks of pregnancy. The normal length of a pregnancy is 39 to 41 weeks. CAUSES Often, there is no identifiable underlying cause as to why a woman goes into preterm labor. One of the most common   known causes of preterm labor is infection. Infections of the uterus, cervix, vagina, amniotic sac, bladder, kidney, or even the lungs (pneumonia) can cause labor to start. Other suspected causes of preterm labor include:   Urogenital infections, such as yeast infections and bacterial vaginosis.   Uterine abnormalities (uterine shape, uterine septum, fibroids, or bleeding from the placenta).   A cervix that has been operated on (it may fail to stay closed).   Malformations in the fetus.   Multiple gestations (twins, triplets, and so on).   Breakage of the amniotic sac.  RISK FACTORS  Having a previous history of preterm labor.   Having premature rupture of membranes (PROM).   Having a placenta that covers the opening of the cervix (placenta previa).   Having a placenta that separates from the uterus (placental abruption).   Having a cervix that is too weak to hold the fetus in the uterus (incompetent cervix).   Having too much fluid in the amniotic sac (polyhydramnios).   Taking illegal drugs or smoking while pregnant.   Not gaining enough weight while pregnant.   Being younger than 18 and older than 33 years old.   Having a low socioeconomic status.   Being African American. SYMPTOMS Signs and symptoms of preterm labor include:   Menstrual-like cramps, abdominal pain, or back pain.  Uterine contractions that are regular, as frequent as six in an hour, regardless of their intensity (may be mild or painful).  Contractions that start on the top of the uterus and spread down to the lower abdomen and back.   A sense of increased pelvic pressure.   A watery or bloody mucus discharge that comes  from the vagina.  TREATMENT Depending on the length of the pregnancy and other circumstances, your health care provider may suggest bed rest. If necessary, there are medicines that can be given to stop contractions and to mature the fetal lungs. If labor happens before 34 weeks of pregnancy, a prolonged hospital stay may be recommended. Treatment depends on the condition of both you and the fetus.  WHAT SHOULD YOU DO IF YOU THINK YOU ARE IN PRETERM LABOR? Call your health care provider right away. You will need to go to the hospital to get checked immediately. HOW CAN YOU PREVENT PRETERM LABOR IN FUTURE PREGNANCIES? You should:   Stop smoking if you smoke.  Maintain healthy weight gain and avoid chemicals and drugs that are not necessary.  Be watchful for any type of infection.  Inform your health care provider if you have a known history of preterm labor.   This information is not intended to replace advice given to you by your health care provider. Make sure you discuss any questions you have with your health care provider.   Document Released: 08/04/2003 Document Revised: 01/14/2013 Document Reviewed: 06/16/2012 Elsevier Interactive Patient Education 2016 Elsevier Inc.  

## 2015-06-11 LAB — GLUCOSE TOLERANCE, 1 HOUR (50G) W/O FASTING: GLUCOSE 1 HOUR GTT: 105 mg/dL (ref 70–140)

## 2015-06-11 LAB — RPR

## 2015-06-11 LAB — HIV ANTIBODY (ROUTINE TESTING W REFLEX): HIV 1&2 Ab, 4th Generation: NONREACTIVE

## 2015-06-24 ENCOUNTER — Encounter: Payer: Medicaid Other | Admitting: Obstetrics & Gynecology

## 2015-07-25 ENCOUNTER — Ambulatory Visit (INDEPENDENT_AMBULATORY_CARE_PROVIDER_SITE_OTHER): Payer: Medicaid Other | Admitting: Obstetrics and Gynecology

## 2015-07-25 ENCOUNTER — Encounter: Payer: Self-pay | Admitting: Obstetrics and Gynecology

## 2015-07-25 VITALS — BP 132/82 | HR 98 | Wt 159.0 lb

## 2015-07-25 DIAGNOSIS — Z3483 Encounter for supervision of other normal pregnancy, third trimester: Secondary | ICD-10-CM

## 2015-07-25 NOTE — Progress Notes (Signed)
Pt states she is experiencing Braxton Hicks contractions occasionally.

## 2015-07-25 NOTE — Progress Notes (Signed)
Subjective:  [name] [name] is a 35 y.o. [phone] at [redacted]w[redacted]d being seen today for ongoing prenatal care.  She is currently monitored for the following issues for this low-risk pregnancy and has Supervision of normal pregnancy, antepartum on her problem list.  Patient reports no complaints.  Contractions: Irregular. Vag. Bleeding: None.  Movement: Present. Denies leaking of fluid.   The following portions of the patient's history were reviewed and updated as appropriate: allergies, current medications, past family history, past medical history, past social history, past surgical history and problem list. Problem list updated.  Objective:   Filed Vitals:   07/25/15 1407  BP: 132/82  Pulse: 98  Weight: 159 lb (72.122 kg)    Fetal Status: Fetal Heart Rate (bpm): 146   Movement: Present     General:  Alert, oriented and cooperative. Patient is in no acute distress.  Skin: Skin is warm and dry. No rash noted.   Cardiovascular: Normal heart rate noted  Respiratory: Normal respiratory effort, no problems with respiration noted  Abdomen: Soft, gravid, appropriate for gestational age. Pain/Pressure: Present     Pelvic: Vag. Bleeding: None Vag D/C Character: Thin   Cervical exam deferred        Extremities: Normal range of motion.  Edema: Trace  Mental Status: Normal mood and affect. Normal behavior. Normal judgment and thought content.   Urinalysis:      Assessment and Plan:  Pregnancy: [id number] at [redacted]w[redacted]d  1. Supervision of normal pregnancy, antepartum, third trimester Patient is doing well without complaints She remains undecided on contraception  Preterm labor symptoms and general obstetric precautions including but not limited to vaginal bleeding, contractions, leaking of fluid and fetal movement were reviewed in detail with the patient. Please refer to After Visit Summary for other counseling recommendations.  Return in about 2 weeks (around 08/08/2015).   Catalina Antigua, MD

## 2015-08-11 ENCOUNTER — Ambulatory Visit (INDEPENDENT_AMBULATORY_CARE_PROVIDER_SITE_OTHER): Payer: Medicaid Other | Admitting: Obstetrics & Gynecology

## 2015-08-11 ENCOUNTER — Encounter (HOSPITAL_COMMUNITY): Payer: Self-pay | Admitting: General Surgery

## 2015-08-11 ENCOUNTER — Inpatient Hospital Stay (HOSPITAL_COMMUNITY)
Admission: AD | Admit: 2015-08-11 | Discharge: 2015-08-14 | DRG: 775 | Disposition: A | Discharge status: Home / Self Care | Payer: Medicaid Other | Source: Ambulatory Visit | Attending: Obstetrics & Gynecology | Admitting: Obstetrics & Gynecology

## 2015-08-11 VITALS — BP 153/100 | HR 90 | Temp 98.0°F | Wt 157.0 lb

## 2015-08-11 DIAGNOSIS — R6889 Other general symptoms and signs: Secondary | ICD-10-CM

## 2015-08-11 DIAGNOSIS — O1493 Unspecified pre-eclampsia, third trimester: Secondary | ICD-10-CM

## 2015-08-11 DIAGNOSIS — O0993 Supervision of high risk pregnancy, unspecified, third trimester: Secondary | ICD-10-CM

## 2015-08-11 DIAGNOSIS — O99013 Anemia complicating pregnancy, third trimester: Secondary | ICD-10-CM | POA: Insufficient documentation

## 2015-08-11 DIAGNOSIS — Z87891 Personal history of nicotine dependence: Secondary | ICD-10-CM

## 2015-08-11 DIAGNOSIS — O1414 Severe pre-eclampsia complicating childbirth: Secondary | ICD-10-CM | POA: Diagnosis present

## 2015-08-11 DIAGNOSIS — O134 Gestational [pregnancy-induced] hypertension without significant proteinuria, complicating childbirth: Secondary | ICD-10-CM | POA: Diagnosis present

## 2015-08-11 DIAGNOSIS — D649 Anemia, unspecified: Secondary | ICD-10-CM

## 2015-08-11 DIAGNOSIS — D573 Sickle-cell trait: Secondary | ICD-10-CM | POA: Diagnosis present

## 2015-08-11 DIAGNOSIS — Z3A37 37 weeks gestation of pregnancy: Secondary | ICD-10-CM

## 2015-08-11 DIAGNOSIS — Z833 Family history of diabetes mellitus: Secondary | ICD-10-CM

## 2015-08-11 DIAGNOSIS — O9902 Anemia complicating childbirth: Secondary | ICD-10-CM | POA: Diagnosis present

## 2015-08-11 LAB — COMPREHENSIVE METABOLIC PANEL
ALT: 15 U/L (ref 14–54)
AST: 39 U/L (ref 15–41)
Albumin: 2.4 g/dL — ABNORMAL LOW (ref 3.5–5.0)
Alkaline Phosphatase: 93 U/L (ref 38–126)
Anion gap: 9 (ref 5–15)
BILIRUBIN TOTAL: 0.6 mg/dL (ref 0.3–1.2)
CO2: 28 mmol/L (ref 22–32)
Calcium: 7.8 mg/dL — ABNORMAL LOW (ref 8.9–10.3)
Chloride: 100 mmol/L — ABNORMAL LOW (ref 101–111)
Creatinine, Ser: 0.46 mg/dL (ref 0.44–1.00)
Glucose, Bld: 94 mg/dL (ref 65–99)
POTASSIUM: 2.7 mmol/L — AB (ref 3.5–5.1)
Sodium: 137 mmol/L (ref 135–145)
TOTAL PROTEIN: 5.5 g/dL — AB (ref 6.5–8.1)

## 2015-08-11 LAB — CBC
HEMATOCRIT: 26.3 % — AB (ref 36.0–46.0)
Hemoglobin: 9 g/dL — ABNORMAL LOW (ref 12.0–15.0)
MCH: 29.4 pg (ref 26.0–34.0)
MCHC: 34.2 g/dL (ref 30.0–36.0)
MCV: 85.9 fL (ref 78.0–100.0)
PLATELETS: 313 10*3/uL (ref 150–400)
RBC: 3.06 MIL/uL — ABNORMAL LOW (ref 3.87–5.11)
RDW: 15.2 % (ref 11.5–15.5)
WBC: 14.4 10*3/uL — ABNORMAL HIGH (ref 4.0–10.5)

## 2015-08-11 LAB — TYPE AND SCREEN
ABO/RH(D): A POS
ANTIBODY SCREEN: NEGATIVE

## 2015-08-11 LAB — GROUP B STREP BY PCR: GROUP B STREP BY PCR: NEGATIVE

## 2015-08-11 LAB — OB RESULTS CONSOLE GBS: GBS: NEGATIVE

## 2015-08-11 LAB — PROTEIN / CREATININE RATIO, URINE
Creatinine, Urine: 45 mg/dL
Protein Creatinine Ratio: 3.13 mg/mg{Cre} — ABNORMAL HIGH (ref 0.00–0.15)
Total Protein, Urine: 141 mg/dL

## 2015-08-11 LAB — TSH: TSH: 2.284 u[IU]/mL (ref 0.350–4.500)

## 2015-08-11 MED ORDER — OXYCODONE-ACETAMINOPHEN 5-325 MG PO TABS: 2.0000 | ORAL_TABLET | ORAL | Status: DC | PRN

## 2015-08-11 MED ORDER — OXYTOCIN BOLUS FROM INFUSION: 500.0000 mL | INTRAVENOUS | Status: DC

## 2015-08-11 MED ORDER — TERBUTALINE SULFATE 1 MG/ML IJ SOLN: 0.2500 mg | Freq: Once | INTRAMUSCULAR | Status: DC | PRN

## 2015-08-11 MED ORDER — LIDOCAINE HCL (PF) 1 % IJ SOLN
30.0000 mL | INTRAMUSCULAR | Status: DC | PRN
  Filled 2015-08-11: qty 30

## 2015-08-11 MED ORDER — POTASSIUM CHLORIDE 10 MEQ/100ML IV SOLN
10.0000 meq | INTRAVENOUS | Status: AC
  Administered 2015-08-11 (×2): 10 meq via INTRAVENOUS
  Filled 2015-08-11 (×2): qty 100

## 2015-08-11 MED ORDER — CITRIC ACID-SODIUM CITRATE 334-500 MG/5ML PO SOLN: 30.0000 mL | ORAL | Status: DC | PRN

## 2015-08-11 MED ORDER — OXYCODONE-ACETAMINOPHEN 5-325 MG PO TABS: 1.0000 | ORAL_TABLET | ORAL | Status: DC | PRN

## 2015-08-11 MED ORDER — OXYTOCIN 10 UNIT/ML IJ SOLN
1.0000 m[IU]/min | INTRAVENOUS | Status: DC
  Administered 2015-08-12: 2 m[IU]/min via INTRAVENOUS
  Filled 2015-08-11: qty 10

## 2015-08-11 MED ORDER — MISOPROSTOL 25 MCG QUARTER TABLET
25.0000 ug | ORAL_TABLET | ORAL | Status: DC | PRN
  Administered 2015-08-11: 25 ug via VAGINAL
  Filled 2015-08-11: qty 1
  Filled 2015-08-11: qty 0.25

## 2015-08-11 MED ORDER — LACTATED RINGERS IV SOLN: 500.0000 mL | INTRAVENOUS | Status: DC | PRN

## 2015-08-11 MED ORDER — ONDANSETRON HCL 4 MG/2ML IJ SOLN: 4.0000 mg | Freq: Four times a day (QID) | INTRAMUSCULAR | Status: DC | PRN

## 2015-08-11 MED ORDER — ACETAMINOPHEN 325 MG PO TABS: 650.0000 mg | ORAL_TABLET | ORAL | Status: DC | PRN

## 2015-08-11 MED ORDER — LACTATED RINGERS IV SOLN
INTRAVENOUS | Status: DC
  Administered 2015-08-11 – 2015-08-12 (×2): via INTRAVENOUS

## 2015-08-11 MED ORDER — OXYTOCIN 10 UNIT/ML IJ SOLN: 2.5000 [IU]/h | INTRAVENOUS | Status: DC

## 2015-08-11 NOTE — Progress Notes (Signed)
Subjective:  [name] [name] is a 35 y.o. [phone] at 6375w0d being seen today for ongoing prenatal care.  She is currently monitored for the following issues for this high-risk pregnancy and has Supervision of high-risk pregnancy; Hypertension in pregnancy, preeclampsia; and Anemia affecting pregnancy in third trimester on her problem list.  Patient missed her last appointment and was unable to reschedule. Reports "not feeling well" for a few days.  Has RUQ pain, mild headache, no visual symptoms.  Also has nausea and vomiting, feels extremes of hot and cold.   Contractions: Irregular. Vag. Bleeding: None.  Movement: Present. Denies leaking of fluid.   The following portions of the patient's history were reviewed and updated as appropriate: allergies, current medications, past family history, past medical history, past social history, past surgical history and problem list. Problem list updated.  Objective:   Filed Vitals:   08/11/15 1044 08/11/15 1109  BP: 147/96 153/100  Pulse: 90   Temp:  98 F (36.7 C)  TempSrc:  Oral  Weight: 157 lb (71.215 kg)     Fetal Status: Fetal Heart Rate (bpm): 151 Fundal Height: 37 cm Movement: Present     General:  Alert, oriented and cooperative. +Exophthalmos noted  Skin: Skin is warm and dry. No rash noted.   Cardiovascular: Normal heart rate noted  Respiratory: Normal respiratory effort, no problems with respiration noted  Abdomen: Soft, gravid, appropriate for gestational age. Pain/Pressure: Present     Pelvic: Vag. Bleeding: None Vag D/C Character: Thin  Cervical exam deferred        Extremities: Normal range of motion.  Edema: Mild pitting, slight indentation  Mental Status: Normal mood and affect. Normal behavior. Normal judgment and thought content.   Urinalysis: Urine Protein: 3+ Urine Glucose: Negative NST performed today was reviewed and was found to be reactive.   Assessment and Plan:  Pregnancy: [id number] at 4075w0d  1. Hypertension in  pregnancy, preeclampsia, third trimester BP, symptoms and proteinuria consistent with likely preeclampsia.  Discuss[facility] with Dr[facility] Jolayne Pantheronstant[facility], who agreed with my plan for IOL.  Reactive NST in office prior to discharge to hospital. - Fetal nonstress test  2. Anemia affecting pregnancy in third trimester L&D team informed.  3. Temperature intolerance Exophthalmos noted on exam, will check TSH on admission.  L&D team informed.  4. Supervision of high-risk pregnancy, third trimester Please refer to After Visit Summary for other counseling recommendations.  Return in about 6 weeks (around 09/22/2015) for Postpartum check.   Tereso NewcomerUgonna A Albie Arizpe, MD

## 2015-08-11 NOTE — Progress Notes (Signed)
   Samara DeistRayshonda D Firman is a 35 y.o. [id number] at 4548w0d  admitted for induction of labor due to Pre-eclamptic toxemia of pregnancy..  Subjective: Contractions are about the same   Objective: Filed Vitals:   08/11/15 1701 08/11/15 1801 08/11/15 1900 08/11/15 2001  BP: 142/83 148/88 155/89 151/99  Pulse: 98 88 86 81  Temp:    98.4 F (36.9 C)  TempSrc:    Oral  Resp: 16 17 16 16   Height:    5\' 1"  (1.549 m)  Weight:    71.215 kg (157 lb)  SpO2:          FHT:  FHR: 140 bpm, variability: moderate,  accelerations:  Present,  decelerations:  Absent UC:   regular, every 2 minutes SVE:  1-2/50--2 Labs: Lab Results  Component Value Date   WBC 14.4* 08/11/2015   HGB 9.0* 08/11/2015   HCT 26.3* 08/11/2015   MCV 85.9 08/11/2015   PLT 313 08/11/2015   Pr/Cr ratio 3gms  Assessment / Plan: IOL for preeclampsia, mild features, ripening phase Foley placed and inflated with 60cc H20 Labor: Progressing normally Fetal Wellbeing:  Category I Pain Control:  Labor support without medications Anticipated MOD:  NSVD  CRESENZO-DISHMAN,Clayden Withem 08/11/2015, 9:18 PM

## 2015-08-11 NOTE — Patient Instructions (Signed)
Please go to [facility] for admission for induction of labor due to suspected preeclampsia  Preeclampsia and Eclampsia Preeclampsia is a serious condition that develops only during pregnancy. It is also called toxemia of pregnancy. This condition causes high blood pressure along with other symptoms, such as swelling and headaches. These may develop as the condition gets worse. Preeclampsia may occur 20 weeks or later into your pregnancy.  Diagnosing and treating preeclampsia early is very important. If not treated early, it can cause serious problems for you and your baby. One problem it can lead to is eclampsia, which is a condition that causes muscle jerking or shaking (convulsions) in the mother. Delivering your baby is the best treatment for preeclampsia or eclampsia.  RISK FACTORS The cause of preeclampsia is not known. You may be more likely to develop preeclampsia if you have certain risk factors. These include:   Being pregnant for the first time.  Having preeclampsia in a past pregnancy.  Having a family history of preeclampsia.  Having high blood pressure.  Being pregnant with twins or triplets.  Being 44 or older.  Being African American.  Having kidney disease or diabetes.  Having medical conditions such as lupus or blood diseases.  Being very overweight (obese). SIGNS AND SYMPTOMS  The earliest signs of preeclampsia are:  High blood pressure.  Increased protein in your urine. Your health care provider will check for this at every prenatal visit. Other symptoms that can develop include:   Severe headaches.  Sudden weight gain.  Swelling of your hands, face, legs, and feet.  Feeling sick to your stomach (nauseous) and throwing up (vomiting).  Vision problems (blurred or double vision).  Numbness in your face, arms, legs, and feet.  Dizziness.  Slurred speech.  Sensitivity to bright lights.  Abdominal pain. DIAGNOSIS  There are no screening tests  for preeclampsia. Your health care provider will ask you about symptoms and check for signs of preeclampsia during your prenatal visits. You may also have tests, including:  Urine testing.  Blood testing.  Checking your baby's heart rate.  Checking the health of your baby and your placenta using images created with sound waves (ultrasound). TREATMENT  You can work out the best treatment approach together with your health care provider. It is very important to keep all prenatal appointments. If you have an increased risk of preeclampsia, you may need more frequent prenatal exams.  Your health care provider may prescribe bed rest.  You may have to eat as little salt as possible.  You may need to take medicine to lower your blood pressure if the condition does not respond to more conservative measures.  You may need to stay in the hospital if your condition is severe. There, treatment will focus on controlling your blood pressure and fluid retention. You may also need to take medicine to prevent seizures.  If the condition gets worse, your baby may need to be delivered early to protect you and the baby. You may have your labor started with medicine (be induced), or you may have a cesarean delivery.  Preeclampsia usually goes away after the baby is born. HOME CARE INSTRUCTIONS   Only take over-the-counter or prescription medicines as directed by your health care provider.  Lie on your left side while resting. This keeps pressure off your baby.  Elevate your feet while resting.  Get regular exercise. Ask your health care provider what type of exercise is safe for you.  Avoid caffeine and alcohol.  Do not smoke.  Drink 6-8 glasses of water every day.  Eat a balanced diet that is low in salt. Do not add salt to your food.  Avoid stressful situations as much as possible.  Get plenty of rest and sleep.  Keep all prenatal appointments and tests as scheduled. SEEK MEDICAL CARE  IF:  You are gaining more weight than expected.  You have any headaches, abdominal pain, or nausea.  You are bruising more than usual.  You feel dizzy or light-headed. SEEK IMMEDIATE MEDICAL CARE IF:   You develop sudden or severe swelling anywhere in your body. This usually happens in the legs.  You gain 5 lb (2.3 kg) or more in a week.  You have a severe headache, dizziness, problems with your vision, or confusion.  You have severe abdominal pain.  You have lasting nausea or vomiting.  You have a seizure.  You have trouble moving any part of your body.  You develop numbness in your body.  You have trouble speaking.  You have any abnormal bleeding.  You develop a stiff neck.  You pass out. MAKE SURE YOU:   Understand these instructions.  Will watch your condition.  Will get help right away if you are not doing well or get worse.   This information is not intended to replace advice given to you by your health care provider. Make sure you discuss any questions you have with your health care provider.   Document Released: 05/11/2000 Document Revised: 05/19/2013 Document Reviewed: 03/06/2013 Elsevier Interactive Patient Education 2016 [employer]   Labor Induction Labor induction is when steps are taken to cause a pregnant woman to begin the labor process. Most women go into labor on their own between 37 weeks and 42 weeks of the pregnancy. When this does not happen or when there is a medical need, methods may be used to induce labor. Labor induction causes a pregnant woman's uterus to contract. It also causes the cervix to soften (ripen), open (dilate), and thin out (efface). Usually, labor is not induced before 39 weeks of the pregnancy unless there is a problem with the baby or mother.  Before inducing labor, your health care provider will consider a number of factors, including the following:  The medical condition of you and the baby.   How many weeks along  you are.   The status of the baby's lung maturity.   The condition of the cervix.   The position of the baby.  WHAT ARE THE REASONS FOR LABOR INDUCTION? Labor may be induced for the following reasons:  The health of the baby or mother is at risk.   The pregnancy is overdue by 1 week or more.   The water breaks but labor does not start on its own.   The mother has a health condition or serious illness, such as high blood pressure, infection, placental abruption, or diabetes.  The amniotic fluid amounts are low around the baby.   The baby is distressed.  Convenience or wanting the baby to be born on a certain date is not a reason for inducing labor. WHAT METHODS ARE USED FOR LABOR INDUCTION? Several methods of labor induction may be used, such as:   Prostaglandin medicine. This medicine causes the cervix to dilate and ripen. The medicine will also start contractions. It can be taken by mouth or by inserting a suppository into the vagina.   Inserting a thin tube (catheter) with a balloon on the end into the  vagina to dilate the cervix. Once inserted, the balloon is expanded with water, which causes the cervix to open.   Stripping the membranes. Your health care provider separates amniotic sac tissue from the cervix, causing the cervix to be stretched and causing the release of a hormone called progesterone. This may cause the uterus to contract. It is often done during an office visit. You will be sent home to wait for the contractions to begin. You will then come in for an induction.   Breaking the water. Your health care provider makes a hole in the amniotic sac using a small instrument. Once the amniotic sac breaks, contractions should begin. This may still take hours to see an effect.   Medicine to trigger or strengthen contractions. This medicine is given through an IV access tube inserted into a vein in your arm.  All of the methods of induction, besides stripping  the membranes, will be done in the hospital. Induction is done in the hospital so that you and the baby can be carefully monitored.  HOW LONG DOES IT TAKE FOR LABOR TO BE INDUCED? Some inductions can take up to 2-3 days. Depending on the cervix, it usually takes less time. It takes longer when you are induced early in the pregnancy or if this is your first pregnancy. If a mother is still pregnant and the induction has been going on for 2-3 days, either the mother will be sent home or a cesarean delivery will be needed. WHAT ARE THE RISKS ASSOCIATED WITH LABOR INDUCTION? Some of the risks of induction include:   Changes in fetal heart rate, such as too high, too low, or erratic.   Fetal distress.   Chance of infection for the mother and baby.   Increased chance of having a cesarean delivery.   Breaking off (abruption) of the placenta from the uterus (rare).   Uterine rupture (very rare).  When induction is needed for medical reasons, the benefits of induction may outweigh the risks. WHAT ARE SOME REASONS FOR NOT INDUCING LABOR? Labor induction should not be done if:   It is shown that your baby does not tolerate labor.   You have had previous surgeries on your uterus, such as a myomectomy or the removal of fibroids.   Your placenta lies very low in the uterus and blocks the opening of the cervix (placenta previa).   Your baby is not in a head-down position.   The umbilical cord drops down into the birth canal in front of the baby. This could cut off the baby's blood and oxygen supply.   You have had a previous cesarean delivery.   There are unusual circumstances, such as the baby being extremely premature.    This information is not intended to replace advice given to you by your health care provider. Make sure you discuss any questions you have with your health care provider.   Document Released: 10/03/2006 Document Revised: 06/04/2014 Document Reviewed:  12/11/2012 Elsevier Interactive Patient Education [employer]

## 2015-08-11 NOTE — H&P (Signed)
LABOR AND DELIVERY ADMISSION HISTORY AND PHYSICAL NOTE  [name] [name] is a 35 y.o. female [phone] with IUP at 2728w0d by 9wk US presenting for IOL.  This patient was seen in the outpatient setting with c/o of RUQ pain and mild HA in the setting of HTN; denied vision change.  Had Upro 3+.  Reactive NST.   Currently she reports positive fetal movement. She denies leakage of clear fluid or vaginal bleeding. Denies HA, photopsia, scotomata, or RUQ pain.  Endorses swelling and dyspnea.  Past Ob Hx:  Term, SVD.  Denies history of GBS colonization, chorioamnitis, postpartum hemorrhage, or shoulder distocia.  Prenatal History/Complications: Supervision of high-risk pregnancy; Hypertension in pregnancy, preeclampsia; and Anemia affecting pregnancy in third trimester   Past Medical History: Past Medical History  Diagnosis Date  . Sickle cell trait (HCC)   . Ovarian cyst     Past Surgical History: Past Surgical History  Procedure Laterality Date  . Induced abortion      Obstetrical History: OB History    Gravida Para Term Preterm AB TAB SAB Ectopic Multiple Living   6 4 4  1     4       Social History: Social History   Social History  . Marital Status: Single    Spouse Name: N/A  . Number of Children: N/A  . Years of Education: N/A   Social History Main Topics  . Smoking status: Former Smoker -- 0.50 packs/day    Types: Cigarettes    Quit date: 10/27/2014  . Smokeless tobacco: Not on file  . Alcohol Use: Yes     Comment: not since pregnancy  . Drug Use: No  . Sexual Activity: Yes    Birth Control/ Protection: None   Other Topics Concern  . Not on file   Social History Narrative    Family History: Family History  Problem Relation Age of Onset  . Cancer Mother     Breast  . Diabetes Mother     Allergies: No Known Allergies  Prescriptions prior to admission  Medication Sig Dispense Refill Last Dose  . Acetaminophen (TYLENOL PO) Take 1 tablet by mouth every  6 (six) hours as needed (headache).   Taking  . flintstones complete (FLINTSTONES) 60 MG chewable tablet Chew 2 tablets by mouth daily.   Taking  . promethazine (PHENERGAN) 25 MG tablet Take 0.5 tablets (12.5 mg total) by mouth every 6 (six) hours as needed for nausea or vomiting. (Patient not taking: Reported on 04/11/2015) 30 tablet 2 Not Taking     Review of Systems   All systems reviewed and negative except as stated in HPI  Last menstrual period 11/30/2014. General appearance: alert and cooperative  HEENT: exopthalmos, suspected Lungs: clear to auscultation bilaterally; no crackles Heart: regular rate and rhythm Abdomen: soft, non-tender; bowel sounds normal Extremities: No calf swelling or tenderness.  No pitting edema. Presentation: cephalic Fetal monitoring: Category 1 tracing. Uterine activity: Contractions      Prenatal labs: ABO, Rh: A/POS/-- (11/14 1559) Antibody: NEG (11/14 1559) Rubella: !Error! RPR: NON REAC (01/13 0923)  HBsAg: NEGATIVE (11/14 1559)  HIV: NONREACTIVE (01/13 0923)  GBS:   Unknown 1 hr Glucola:  Genetic screening:  Declined Anatomy US: Nml  Prenatal Transfer Tool  Maternal Diabetes: No Genetic Screening: Declined Maternal Ultrasounds/Referrals: Normal Fetal Ultrasounds or other Referrals:  None Maternal Substance Abuse:  No Significant Maternal Medications:  None Significant Maternal Lab Results: Lab values include: Other:  Unknown  No results found  for this or any previous visit (from the past 24 hour(s)).  Patient Active Problem List   Diagnosis Date Noted  . Hypertension in pregnancy, preeclampsia 08/11/2015  . Anemia affecting pregnancy in third trimester 08/11/2015  . Supervision of high-risk pregnancy 04/11/2015    Assessment: [name] [name] is a 35 y.o. [id number] at [redacted]w[redacted]d here for IOL 2/2 gHTN w/ or w/o preeclampsia.  #Labor: Augmentation of labor.  Bishop < 8.  Will start cytotec. #gHTN: U/C ratio, cr, and LFTs to  assess for preeclampsia.  Currently asymptomatic.  Will continue to monitor #Pain: Fentanyl.  Has not had epidural during previous labors #FWB: Cat 1 tracing #ID:  GBS pending.  Will proceed with PCN if needed. Rubella pending.  #MOF: Breast and Bottle #MOC: OCP; against LARC #Circ:  Female #Concern for Hyperthyroidism:  Concern for exopthalmos.  TSH pending.    Michael J [place] 08/11/2015, 3:43 PM  OB fellow attestation: I have seen and examined this patient; I agree with above documentation in the resident's note.   [name] [name] is a 35 y.o. [phone] here for elevated blood pressures in clinic, HA, RUQ pain, suspected gHTN vs Preeclampsia  PE: BP 151/99 mmHg  Pulse 81  Temp(Src) 98.4 F (36.9 C) (Oral)  Resp 16  Ht  (1.549 m)  Wt 157 lb (71.215 kg)  BMI 29.68 kg/m2  SpO2 98%  LMP 11/30/2014 (Exact Date) Gen: calm comfortable, NAD Resp: normal effort, no distress Abd: gravid  ROS, labs, PMH reviewed  Plan: Admit for IOL, plan for cytotec for cervical ripening Agree with work up for gHTN vs Preeclampsia, no need for magnesium at this point GBS- pcr negative, no need for treatment Exthalmos- TSH drawn. Will follow up  Family Medicine, OB Fellow 08/11/2015, 8:12 PM

## 2015-08-11 NOTE — Consults (Signed)
  Anesthesia Pain Consult Note  Patient: [name] [name], 35 y.o., female  Consult Requested by: Catalina AntiguaPeggy Constant, MD  Reason for Consult: **CRNA pain rounds  Level of Consciousness: alert  Pain: current pain 0, pain goal 10.  Desires IV pain meds, declines an epidural.  All questions answered.   Last Vitals:     [name],[name] 08/11/2015

## 2015-08-11 NOTE — Progress Notes (Signed)
Patient came in for ob follow up. Patients blood pressure has been elevated and she has been having hot flashes. Patient also stated she went to wic office last week and her HGB was 8.

## 2015-08-11 NOTE — Progress Notes (Signed)
CRITICAL VALUE ALERT  Critical value received:  Potassium 2.7  Date of notification:  08/11/2015  Time of notification:  1702  Critical value read back:Yes.    Nurse who received alert:  Royal Hawthornaney Datrell Dunton [facility]  MD notified (1st page):  Dr. EstoniaBrazil  Time of first page:  1705  Responding MD:  Dr. EstoniaBrazil  Time MD responded:  [phone]

## 2015-08-12 ENCOUNTER — Encounter (HOSPITAL_COMMUNITY): Payer: Self-pay | Admitting: *Deleted

## 2015-08-12 DIAGNOSIS — Z3A37 37 weeks gestation of pregnancy: Secondary | ICD-10-CM

## 2015-08-12 DIAGNOSIS — O1414 Severe pre-eclampsia complicating childbirth: Secondary | ICD-10-CM

## 2015-08-12 LAB — COMPREHENSIVE METABOLIC PANEL
ALBUMIN: 2.6 g/dL — AB (ref 3.5–5.0)
ALK PHOS: 93 U/L (ref 38–126)
ALT: 16 U/L (ref 14–54)
ANION GAP: 8 (ref 5–15)
AST: 37 U/L (ref 15–41)
BILIRUBIN TOTAL: 0.6 mg/dL (ref 0.3–1.2)
BUN: <5 mg/dL — ABNORMAL LOW (ref 6–20)
CALCIUM: 8 mg/dL — AB (ref 8.9–10.3)
CO2: 26 mmol/L (ref 22–32)
CREATININE: 0.49 mg/dL (ref 0.44–1.00)
Chloride: 105 mmol/L (ref 101–111)
GFR calc Af Amer: >60 mL/min (ref >60)
GFR calc non Af Amer: >60 mL/min (ref >60)
GLUCOSE: 83 mg/dL (ref 65–99)
Potassium: 2.7 mmol/L — CL (ref 3.5–5.1)
SODIUM: 139 mmol/L (ref 135–145)
TOTAL PROTEIN: 6.1 g/dL — AB (ref 6.5–8.1)

## 2015-08-12 LAB — BASIC METABOLIC PANEL
ANION GAP: 11 (ref 5–15)
CHLORIDE: 105 mmol/L (ref 101–111)
CO2: 18 mmol/L — AB (ref 22–32)
Calcium: 7 mg/dL — ABNORMAL LOW (ref 8.9–10.3)
Creatinine, Ser: 0.73 mg/dL (ref 0.44–1.00)
GFR calc Af Amer: >60 mL/min (ref >60)
GFR calc non Af Amer: >60 mL/min (ref >60)
GLUCOSE: 237 mg/dL — AB (ref 65–99)
POTASSIUM: 2.6 mmol/L — AB (ref 3.5–5.1)
Sodium: 134 mmol/L — ABNORMAL LOW (ref 135–145)

## 2015-08-12 LAB — MAGNESIUM: Magnesium: 1.6 mg/dL — ABNORMAL LOW (ref 1.7–2.4)

## 2015-08-12 LAB — RPR: RPR Ser Ql: NONREACTIVE

## 2015-08-12 MED ORDER — DIPHENHYDRAMINE HCL 25 MG PO CAPS: 25.0000 mg | ORAL_CAPSULE | Freq: Four times a day (QID) | ORAL | Status: DC | PRN

## 2015-08-12 MED ORDER — ONDANSETRON HCL 4 MG/2ML IJ SOLN
4.0000 mg | INTRAMUSCULAR | Status: DC | PRN
  Filled 2015-08-12 (×2): qty 2

## 2015-08-12 MED ORDER — FENTANYL CITRATE (PF) 100 MCG/2ML IJ SOLN
INTRAMUSCULAR | Status: AC
  Administered 2015-08-12: 100 ug via INTRAVENOUS
  Filled 2015-08-12: qty 2

## 2015-08-12 MED ORDER — HYDRALAZINE HCL 20 MG/ML IJ SOLN: 10.0000 mg | Freq: Once | INTRAMUSCULAR | Status: DC | PRN

## 2015-08-12 MED ORDER — ONDANSETRON HCL 4 MG PO TABS: 4.0000 mg | ORAL_TABLET | ORAL | Status: DC | PRN

## 2015-08-12 MED ORDER — ACETAMINOPHEN 325 MG PO TABS
650.0000 mg | ORAL_TABLET | ORAL | Status: DC | PRN
  Administered 2015-08-14: 650 mg via ORAL
  Filled 2015-08-12: qty 2

## 2015-08-12 MED ORDER — WITCH HAZEL-GLYCERIN EX PADS: 1.0000 "application " | MEDICATED_PAD | CUTANEOUS | Status: DC | PRN

## 2015-08-12 MED ORDER — LANOLIN HYDROUS EX OINT: TOPICAL_OINTMENT | CUTANEOUS | Status: DC | PRN

## 2015-08-12 MED ORDER — TETANUS-DIPHTH-ACELL PERTUSSIS 5-2.5-18.5 LF-MCG/0.5 IM SUSP: 0.5000 mL | Freq: Once | INTRAMUSCULAR | Status: DC

## 2015-08-12 MED ORDER — POTASSIUM CHLORIDE 20 MEQ/15ML (10%) PO SOLN
40.0000 meq | Freq: Two times a day (BID) | ORAL | Status: DC
  Administered 2015-08-12: 40 meq via ORAL
  Filled 2015-08-12 (×3): qty 30

## 2015-08-12 MED ORDER — ZOLPIDEM TARTRATE 5 MG PO TABS: 5.0000 mg | ORAL_TABLET | Freq: Every evening | ORAL | Status: DC | PRN

## 2015-08-12 MED ORDER — OXYCODONE HCL 5 MG PO TABS: 10.0000 mg | ORAL_TABLET | ORAL | Status: DC | PRN

## 2015-08-12 MED ORDER — KCL IN DEXTROSE-NACL 40-5-0.45 MEQ/L-%-% IV SOLN
INTRAVENOUS | Status: DC
  Administered 2015-08-12: 14:00:00 via INTRAVENOUS
  Filled 2015-08-12 (×2): qty 1000

## 2015-08-12 MED ORDER — PRENATAL MULTIVITAMIN CH
1.0000 | ORAL_TABLET | Freq: Every day | ORAL | Status: DC
  Filled 2015-08-12: qty 1

## 2015-08-12 MED ORDER — BENAZEPRIL HCL 10 MG PO TABS
10.0000 mg | ORAL_TABLET | Freq: Every day | ORAL | Status: DC
  Administered 2015-08-12 – 2015-08-14 (×3): 10 mg via ORAL
  Filled 2015-08-12 (×4): qty 1

## 2015-08-12 MED ORDER — POTASSIUM CHLORIDE CRYS ER 20 MEQ PO TBCR
40.0000 meq | EXTENDED_RELEASE_TABLET | Freq: Once | ORAL | Status: DC
  Filled 2015-08-12: qty 2

## 2015-08-12 MED ORDER — SIMETHICONE 80 MG PO CHEW: 80.0000 mg | CHEWABLE_TABLET | ORAL | Status: DC | PRN

## 2015-08-12 MED ORDER — OXYCODONE HCL 5 MG PO TABS: 5.0000 mg | ORAL_TABLET | ORAL | Status: DC | PRN

## 2015-08-12 MED ORDER — LACTATED RINGERS IV SOLN
2.0000 g/h | INTRAVENOUS | Status: AC
  Administered 2015-08-13: 2 g/h via INTRAVENOUS
  Filled 2015-08-12 (×2): qty 80

## 2015-08-12 MED ORDER — FENTANYL CITRATE (PF) 100 MCG/2ML IJ SOLN
100.0000 ug | INTRAMUSCULAR | Status: DC | PRN
  Administered 2015-08-12: 100 ug via INTRAVENOUS

## 2015-08-12 MED ORDER — HYDROCHLOROTHIAZIDE 25 MG PO TABS: 25.0000 mg | ORAL_TABLET | Freq: Every day | ORAL | Status: DC

## 2015-08-12 MED ORDER — POTASSIUM CHLORIDE 20 MEQ PO PACK
40.0000 meq | PACK | Freq: Two times a day (BID) | ORAL | Status: DC
  Filled 2015-08-12: qty 2

## 2015-08-12 MED ORDER — MAGNESIUM SULFATE 50 % IJ SOLN
2.0000 g/h | INTRAVENOUS | Status: DC
  Administered 2015-08-12: 2 g/h via INTRAVENOUS
  Filled 2015-08-12: qty 80

## 2015-08-12 MED ORDER — MAGNESIUM SULFATE 2 GM/50ML IV SOLN: 2.0000 g | Freq: Once | INTRAVENOUS | Status: DC

## 2015-08-12 MED ORDER — MEASLES, MUMPS & RUBELLA VAC ~~LOC~~ INJ: 0.5000 mL | INJECTION | Freq: Once | SUBCUTANEOUS | Status: DC

## 2015-08-12 MED ORDER — SENNOSIDES-DOCUSATE SODIUM 8.6-50 MG PO TABS
2.0000 | ORAL_TABLET | ORAL | Status: DC
  Administered 2015-08-12 – 2015-08-13 (×2): 2 via ORAL
  Filled 2015-08-12 (×2): qty 2

## 2015-08-12 MED ORDER — LABETALOL HCL 5 MG/ML IV SOLN
20.0000 mg | INTRAVENOUS | Status: DC | PRN
Start: 2015-08-12 — End: 2015-08-12
  Administered 2015-08-12: 40 mg via INTRAVENOUS
  Administered 2015-08-12: 20 mg via INTRAVENOUS
  Filled 2015-08-12: qty 4
  Filled 2015-08-12: qty 8

## 2015-08-12 MED ORDER — MAGNESIUM SULFATE BOLUS VIA INFUSION
6.0000 g | Freq: Once | INTRAVENOUS | Status: AC
  Administered 2015-08-12: 6 g via INTRAVENOUS
  Filled 2015-08-12: qty 500

## 2015-08-12 MED ORDER — KCL IN DEXTROSE-NACL 40-5-0.45 MEQ/L-%-% IV SOLN
INTRAVENOUS | Status: DC
  Administered 2015-08-12 – 2015-08-13 (×2): via INTRAVENOUS
  Filled 2015-08-12 (×6): qty 1000

## 2015-08-12 MED ORDER — BENZOCAINE-MENTHOL 20-0.5 % EX AERO: 1.0000 "application " | INHALATION_SPRAY | CUTANEOUS | Status: DC | PRN

## 2015-08-12 MED ORDER — DIBUCAINE 1 % RE OINT: 1.0000 "application " | TOPICAL_OINTMENT | RECTAL | Status: DC | PRN

## 2015-08-12 NOTE — Progress Notes (Signed)
[name] [name] is a 35 y.o. [id number] at 2132w1d by ultrasound admitted for induction of labor due to Pre-eclamptic toxemia of pregnancy..  Subjective: Pt is comfortable and experiencing contractions. Pt denies HA, visual changes, RUQ pain.  Objective: BP 147/95 mmHg  Pulse 80  Temp(Src) 98.3 F (36.8 C) (Oral)  Resp 18  Ht 5\' 1"  (1.549 m)  Wt 71.215 kg (157 lb)  BMI 29.68 kg/m2  SpO2 98%  LMP 11/30/2014 (Exact Date)      FHT: FHR: 145 bpm, variability: moderate,  accelerations:  Present,  decelerations:  Present Variable UC: regular, every 2-3 minutes   Labs:  Recent Labs    Lab Results  Component Value Date   WBC 14.4* 08/11/2015   HGB 9.0* 08/11/2015   HCT 26.3* 08/11/2015   MCV 85.9 08/11/2015   PLT 313 08/11/2015      Assessment / Plan: Induction of labor due to preeclampsia, Foley bulb still in place.  Labor: Progressing normally, will continue to monitor Foley bulb placement Preeclampsia: Patient currently asymptomatic, BPs spiked to 160/89. Continue to monitor BP and observe for clinical symptoms. If BP remains elevated, consider neuroprotection with MgSO4. Fetal Wellbeing: Category I, continue FHM Pain Control: IV pain meds considered, patient does not desire epidural at this time. I/D: GBS neg Anticipated MOD: NSVD  [name] [name] 08/12/15 12:30 AM

## 2015-08-12 NOTE — Progress Notes (Signed)
   [name] [name] is a 35 y.o. [id number] at 4057w1d  admitted for induction of labor due to Hypertension.  Subjective: Ctx are mild.  Foley fell out around 0200.   Objective: Filed Vitals:   08/12/15 0531 08/12/15 0601 08/12/15 0632 08/12/15 0701  BP: 156/92 153/100 174/104 159/90  Pulse: 90 87 78 91  Temp:  97.5 F (36.4 C)    TempSrc:  Axillary    Resp:  18    Height:      Weight:      SpO2:          FHT:  FHR: 145 bpm, variability: moderate,  accelerations:  Present,  decelerations:  Absent UC:   regular, every 2-3 minutes SVE:   Dilation: 4 Effacement (%): 50 Station: -3 Exam by:: L.Stubbs, RN Pitocin @ 4 mu/min  Labs: Lab Results  Component Value Date   WBC 14.4* 08/11/2015   HGB 9.0* 08/11/2015   HCT 26.3* 08/11/2015   MCV 85.9 08/11/2015   PLT 313 08/11/2015    Assessment / Plan: Induction of labor due to gestational hypertension,  progressing well on pitocin  Labor: Progressing normally Fetal Wellbeing:  Category I Pain Control:  Labor support without medications Anticipated MOD:  NSVD  [name] [name] [name] [name] 08/12/2015, 7:22 AM

## 2015-08-12 NOTE — Progress Notes (Signed)
LABOR PROGRESS NOTE  [name] [name] is a 35 y.o. [phone] at 5910w1d  admitted for pree now w/ severe features  Subjective: No ha, vision change, or epigastric pain  Objective: BP 144/94 mmHg  Pulse 84  Temp(Src) 97.9 F (36.6 C) (Oral)  Resp 18  Ht 5\' 1"  (1.549 m)  Wt 157 lb (71.215 kg)  BMI 29.68 kg/m2  SpO2 98%  LMP 11/30/2014 (Exact Date) or  Filed Vitals:   08/12/15 1300 08/12/15 1330 08/12/15 1400 08/12/15 1430  BP: 146/87 139/84 148/96 144/94  Pulse: 88 97 77 84  Temp:      TempSrc:      Resp:      Height:      Weight:      SpO2:        140/mod/+a/-d  Dilation: 5 Effacement (%): 80 Cervical Position: Anterior Station: -2 Presentation: Vertex Exam by:: Marjo BickerAmber Middleton, RN  Labs: Lab Results  Component Value Date   WBC 14.4* 08/11/2015   HGB 9.0* 08/11/2015   HCT 26.3* 08/11/2015   MCV 85.9 08/11/2015   PLT 313 08/11/2015    Patient Active Problem List   Diagnosis Date Noted  . Hypertension in pregnancy, preeclampsia 08/11/2015  . Anemia affecting pregnancy in third trimester 08/11/2015  . Elevated blood pressure affecting pregnancy, antepartum 08/11/2015  . Supervision of high-risk pregnancy 04/11/2015    Assessment / Plan: 35 y.o. [id number] at 6110w1d here for preE w/ severe features  Labor: now s/p arom, internals placed, will titrate pitocin Fetal Wellbeing:  Cat 1 Pain Control:  Non-pharm Anticipated MOD:  Vag preE w/ severe features: mg started for severe htn, current BPs mildly elevated  [name], MD 08/12/2015, 3:07 PM

## 2015-08-13 LAB — BASIC METABOLIC PANEL
ANION GAP: 4 — AB (ref 5–15)
BUN: <5 mg/dL — ABNORMAL LOW (ref 6–20)
CALCIUM: 6.3 mg/dL — AB (ref 8.9–10.3)
CHLORIDE: 106 mmol/L (ref 101–111)
CO2: 24 mmol/L (ref 22–32)
Creatinine, Ser: 0.61 mg/dL (ref 0.44–1.00)
GFR calc non Af Amer: >60 mL/min (ref >60)
GLUCOSE: 101 mg/dL — AB (ref 65–99)
Potassium: 3.2 mmol/L — ABNORMAL LOW (ref 3.5–5.1)
Sodium: 134 mmol/L — ABNORMAL LOW (ref 135–145)

## 2015-08-13 LAB — MAGNESIUM: MAGNESIUM: 5.5 mg/dL — AB (ref 1.7–2.4)

## 2015-08-13 LAB — POTASSIUM: Potassium: 2.7 mmol/L — CL (ref 3.5–5.1)

## 2015-08-13 MED ORDER — POTASSIUM CHLORIDE CRYS ER 20 MEQ PO TBCR
40.0000 meq | EXTENDED_RELEASE_TABLET | Freq: Once | ORAL | Status: DC
  Filled 2015-08-13: qty 2

## 2015-08-13 MED ORDER — POTASSIUM CHLORIDE 20 MEQ/15ML (10%) PO SOLN: 40.0000 meq | Freq: Once | ORAL | Status: DC

## 2015-08-13 MED ORDER — FERROUS SULFATE 325 (65 FE) MG PO TABS
325.0000 mg | ORAL_TABLET | Freq: Two times a day (BID) | ORAL | Status: DC
  Administered 2015-08-14: 325 mg via ORAL
  Filled 2015-08-13 (×2): qty 1

## 2015-08-13 MED ORDER — COMPLETENATE 29-1 MG PO CHEW
1.0000 | CHEWABLE_TABLET | Freq: Every day | ORAL | Status: DC
  Administered 2015-08-13 – 2015-08-14 (×2): 1 via ORAL
  Filled 2015-08-13 (×3): qty 1

## 2015-08-13 MED ORDER — POTASSIUM CHLORIDE 20 MEQ/15ML (10%) PO SOLN
40.0000 meq | ORAL | Status: DC
  Filled 2015-08-13 (×3): qty 30

## 2015-08-13 MED ORDER — POTASSIUM CHLORIDE CRYS ER 20 MEQ PO TBCR
40.0000 meq | EXTENDED_RELEASE_TABLET | Freq: Two times a day (BID) | ORAL | Status: DC
  Filled 2015-08-13: qty 2

## 2015-08-13 MED ORDER — DOCUSATE SODIUM 100 MG PO CAPS: 100.0000 mg | ORAL_CAPSULE | Freq: Two times a day (BID) | ORAL | Status: DC | PRN

## 2015-08-13 MED ORDER — BREAST MILK
ORAL | Status: DC
  Filled 2015-08-13: qty 1

## 2015-08-13 MED ORDER — POTASSIUM CHLORIDE CRYS ER 20 MEQ PO TBCR
40.0000 meq | EXTENDED_RELEASE_TABLET | ORAL | Status: AC
  Administered 2015-08-13 (×3): 40 meq via ORAL
  Filled 2015-08-13 (×3): qty 2

## 2015-08-13 MED ORDER — POTASSIUM CHLORIDE CRYS ER 20 MEQ PO TBCR
40.0000 meq | EXTENDED_RELEASE_TABLET | ORAL | Status: AC
  Administered 2015-08-13: 40 meq via ORAL
  Filled 2015-08-13 (×2): qty 2

## 2015-08-13 MED ORDER — POTASSIUM CHLORIDE CRYS ER 20 MEQ PO TBCR
40.0000 meq | EXTENDED_RELEASE_TABLET | ORAL | Status: DC
  Administered 2015-08-13: 40 meq via ORAL
  Filled 2015-08-13 (×4): qty 2

## 2015-08-13 NOTE — Progress Notes (Signed)
Results for Hemp, Morine D (MRN [id number]) as of 08/13/2015 17:57  Ref. Range 08/13/2015 16:58  Sodium Latest Ref Range: 135-145 mmol/L 134 (L)  Potassium Latest Ref Range: 3.5-5.1 mmol/L 3.2 (L)  Chloride Latest Ref Range: 101-111 mmol/L 106  CO2 Latest Ref Range: 22-32 mmol/L 24  BUN Latest Ref Range: 6-20 mg/dL PENDING  Creatinine Latest Ref Range: 0.44-1.00 mg/dL 0.61  Calcium Latest Ref Range: 8.9-10.3 mg/dL 6.3 (LL)  EGFR (Non-African Amer.) Latest Ref Range: >60 mL/min >60  EGFR (African American) Latest Ref Range: >60 mL/min >60  Glucose Latest Ref Range: 65-99 mg/dL 101 (H)  Anion gap Latest Ref Range: 5-15  4 (L)  Dr. Phelps notified of patients potassium and calcium levels.  Will place orders. 

## 2015-08-13 NOTE — Progress Notes (Signed)
PotassiumCRITICAL VALUE ALERT  Critical value received: Potassium level= 2.6      Date of notification:  08/13/15  Time of notification:  0645  Critical value read back; yes  Nurse who received alert:  Emeline GeneralMLatham RN  MD notified (1st page):  Dr. Alvester MorinNewton  Time of first page:  *0645  MD notified (2nd page):  Time of second page:  Responding MD:  Dr. Alvester MorinNewton  Time MD responded:  [phone]

## 2015-08-13 NOTE — Progress Notes (Signed)
POSTPARTUM PROGRESS NOTE  Post Partum Day 1 Subjective:  [name] [name] Nish is a 35 y.o. [id number] 6676w1d s/p SVD complicated by severe preeclampsia.  No acute events overnight.  Pt denies problems with ambulating, voiding or po intake.  She denies nausea or vomiting. Denies any current headaches, visual changes, RUQ/epigastric pain.  Pain is well controlled.  She has had flatus. She has not had bowel movement.  Lochia Small.   Objective: Blood pressure 135/88, pulse 104, temperature 98.4 F (36.9 C), temperature source Oral, resp. rate 24, height 5\' 1"  (1.549 m), weight 146 lb 6 oz (66.395 kg), last menstrual period 11/30/2014, SpO2 100 %, unknown if currently breastfeeding. Temp:  [97.6 F (36.4 C)-98.4 F (36.9 C)] 98.4 F (36.9 C) (03/18 0520) Pulse Rate:  [77-115] 104 (03/18 0520) Resp:  [12-24] 24 (03/18 0900) BP: (119-175)/(76-103) 135/88 mmHg (03/18 0520) SpO2:  [100 %] 100 % (03/18 0520) Weight:  [146 lb 6 oz (66.395 kg)] 146 lb 6 oz (66.395 kg) (03/18 0524)  Physical Exam:  General: alert, cooperative and no distress Lochia:normal flow Chest: CTAB Heart: RRR no m/r/g Abdomen: soft, nontender,  Uterine Fundus: firm, at umbilicus DVT Evaluation: No calf swelling or tenderness Extremities: no edema, 2+ DTRs  Results for orders placed or performed during the hospital encounter of 08/11/15 (from the past 24 hour(s))  Basic metabolic panel     Status: Abnormal   Collection Time: 08/12/15  6:23 PM  Result Value Ref Range   Sodium 134 (L) 135 - 145 mmol/L   Potassium 2.6 (LL) 3.5 - 5.1 mmol/L   Chloride 105 101 - 111 mmol/L   CO2 18 (L) 22 - 32 mmol/L   Glucose, Bld 237 (H) 65 - 99 mg/dL   BUN <5 (L) 6 - 20 mg/dL   Creatinine, Ser 1.470.73 0.44 - 1.00 mg/dL   Calcium 7.0 (L) 8.9 - 10.3 mg/dL   GFR calc non Af Amer >60 >60 mL/min   GFR calc Af Amer >60 >60 mL/min   Anion gap 11 5 - 15  Magnesium     Status: Abnormal   Collection Time: 08/13/15  6:10 AM  Result Value Ref Range    Magnesium 5.5 (H) 1.7 - 2.4 mg/dL  Potassium     Status: Abnormal   Collection Time: 08/13/15  6:10 AM  Result Value Ref Range   Potassium 2.7 (LL) 3.5 - 5.1 mmol/L     Assessment/Plan:  ASSESSMENT: [name] [name] [place] is a 35 y.o. [id number] 9476w1d s/p SVD complicated by severe preeclampsia. - Continue magnesium sulfate until 1630 today - Continue to monitor BP; currently on Benazepril 10 mg daily - Breast and bottle feeding; baby is stable.  Normal lochia. - Discharge to home tomorrow if BP remains stable   LOS: 2 days   Andrus Sharp A, MD 08/13/2015, 9:18 AM

## 2015-08-13 NOTE — Lactation Note (Signed)
This note was copied from a baby's chart. Lactation Consultation Note  Initial visit done.  Breastfeeding consultation services and support information given to patient.  Mom desires to pump and bottle feed as she did with her previous babies.  She states she has a breast pump at home.  Mom is obtaining 3 mls of colostrum with pumping.  She states baby has been sleepy and won't suck on bottle.  Finger fed baby 1 ml of breast milk and then baby took 2 remaining mls by bottle.  Instructed on sucking exercises.  Reviewed pumping schedule and instructed to call out for assist prn.  Patient Name: [name] [name] ZOXWR'UToday's Date: 08/13/2015 Reason for consult: Initial assessment;Late preterm infant;Infant < 6lbs   Maternal Data Does the patient have breastfeeding experience prior to this delivery?: Yes  Feeding    LATCH Score/Interventions                      Lactation Tools Discussed/Used Pump Review: Setup, frequency, and cleaning;Milk Storage Initiated by:: RN Date initiated:: 08/13/15   Consult Status Consult Status: Follow-up Date: 08/14/15 Follow-up type: In-patient    Huston FoleyMOULDEN, Lilia Letterman S 08/13/2015, 12:03 PM

## 2015-08-14 ENCOUNTER — Ambulatory Visit: Payer: Self-pay

## 2015-08-14 LAB — BASIC METABOLIC PANEL
ANION GAP: 3 — AB (ref 5–15)
BUN: <5 mg/dL — ABNORMAL LOW (ref 6–20)
CHLORIDE: 111 mmol/L (ref 101–111)
CO2: 23 mmol/L (ref 22–32)
Calcium: 7.3 mg/dL — ABNORMAL LOW (ref 8.9–10.3)
Creatinine, Ser: 0.66 mg/dL (ref 0.44–1.00)
GFR calc non Af Amer: >60 mL/min (ref >60)
GLUCOSE: 89 mg/dL (ref 65–99)
POTASSIUM: 3.7 mmol/L (ref 3.5–5.1)
Sodium: 137 mmol/L (ref 135–145)

## 2015-08-14 MED ORDER — BENAZEPRIL HCL 10 MG PO TABS: 10.0000 mg | ORAL_TABLET | Freq: Every day | ORAL | Status: DC

## 2015-08-14 MED ORDER — DOCUSATE SODIUM 100 MG PO CAPS: 100.0000 mg | ORAL_CAPSULE | Freq: Two times a day (BID) | ORAL | Status: DC | PRN

## 2015-08-14 MED ORDER — NORETHINDRONE 0.35 MG PO TABS: 1.0000 | ORAL_TABLET | Freq: Every day | ORAL | Status: DC

## 2015-08-14 MED ORDER — FERROUS SULFATE 325 (65 FE) MG PO TABS: 325.0000 mg | ORAL_TABLET | Freq: Two times a day (BID) | ORAL | Status: DC

## 2015-08-14 NOTE — Progress Notes (Signed)

## 2015-08-14 NOTE — Discharge Instructions (Signed)

## 2015-08-14 NOTE — Lactation Note (Signed)
This note was copied from a baby's chart. Lactation Consultation Note  Patient Name: [name] [name] KISNG'X Date: 08/14/2015 Reason for consult: Follow-up assessment Infant <6 lbs Infant is 24 hours old & seen by [facility] for follow-up assessment. Nurse had informed LC earlier that baby had been a poor feeder (even with the bottle) and that the next feeding would be ~11:30am so LC met the nurse in the room. Baby has lost 7.6% wt. Mom had recently pumped ~8 mL of colostrum. [place] asked if mom wanted help BF and mom reported that she did not feel like BF at this time & that she had a headache so infant would be getting the bottle this feeding. Dad gave her the colostrum and baby drank it fairly quickly. Mom reports that she has had a lot of experience with BF and plans to do both BF and bottle like she had for her previous children. LC reinforced importance of frequent stimulation (either BF or pump) 8-12 x/d. Pt reports no other questions at this time & appeared resistant to [facility] instructions/ education. LC encouraged mom to ask for LC at a future BF if help is desired.   Maternal Data    Feeding Feeding Type: Bottle Fed - Breast Milk Nipple Type: Slow - flow  LATCH Score/Interventions                      Lactation Tools Discussed/Used     Consult Status Consult Status: Follow-up Date: 08/15/15 Follow-up type: In-patient    Yvonna Alanis 08/14/2015, 2:11 PM

## 2015-08-14 NOTE — Discharge Summary (Signed)
OB Discharge Summary     Patient Name: [name] [name] DOB: [birth year] MRN: [id number]  Date of admission: 08/11/2015 Delivering MD: Shonna Chock BEDFORD   Date of discharge: 08/14/2015  Admitting diagnosis: direct admit,induction 37w Intrauterine pregnancy: [redacted]w[redacted]d     Secondary diagnosis:  Principal Problem:   Hypertension in pregnancy, preeclampsia, severe, delivered Active Problems:   Anemia affecting pregnancy in third trimester  Additional problems: None     Discharge diagnosis: Term Pregnancy Delivered, Preeclampsia (severe) and Anemia                                                                                                Post partum procedures:Magnesium sulfate for 24 hour postpartum  Augmentation: AROM and Pitocin  Complications: None  Hospital course:  Induction of Labor With Vaginal Delivery   35 y.o. yo [id number] at [redacted]w[redacted]d was admitted to the hospital 08/11/2015 for induction of labor.  Indication for induction: Severe preeclampsia.  Patient had an uncomplicated labor course as follows: Membrane Rupture Time/Date: 3:03 PM ,08/12/2015   Intrapartum Procedures: Episiotomy: None [1]                                         Lacerations:  None [1]  Patient had delivery of a Viable infant.  Information for the patient's newborn:  Wylodean, Shimmel [[id number]]  Delivery Method: Vaginal, Spontaneous Delivery (Filed from Delivery Summary) 08/12/2015   Details of delivery can be found in separate delivery note.  Patient had a routine postpartum course. Patient is discharged home 08/14/2015.   Physical exam  Filed Vitals:   08/13/15 1810 08/13/15 2139 08/14/15 0222 08/14/15 0509  BP:  140/96 132/87 145/87  Pulse:  99 97 91  Temp:  99 F (37.2 C) 98.9 F (37.2 C) 98.8 F (37.1 C)  TempSrc:  Oral Oral Oral  Resp: Height:      Weight:    144 lb 0.1 oz (65.32 kg)  SpO2:  100% 100% 100%   General: alert and cooperative Lochia:  appropriate Uterine Fundus: firm DVT Evaluation: No evidence of DVT seen on physical exam. Negative Homan's sign. Labs: Lab Results  Component Value Date   WBC 14.4* 08/11/2015   HGB 9.0* 08/11/2015   HCT 26.3* 08/11/2015   MCV 85.9 08/11/2015   PLT 313 08/11/2015   CMP Latest Ref Rng 08/14/2015  Glucose 65 - 99 mg/dL 89  BUN 6 - 20 mg/dL <4(O)  Creatinine 9.62 - 1.00 mg/dL 9.52  Sodium 841 - 324 mmol/L 137  Potassium 3.5 - 5.1 mmol/L 3.7  Chloride 101 - 111 mmol/L 111  CO2 22 - 32 mmol/L 23  Calcium 8.9 - 10.3 mg/dL 7.3(L)  Total Protein 6.5 - 8.1 g/dL -  Total Bilirubin 0.3 - 1.2 mg/dL -  Alkaline Phos 38 - 401 U/L -  AST 15 - 41 U/L -  ALT 14 - 54 U/L -    Discharge instruction: per After Visit Summary and "Baby  and Me Booklet".  After visit meds:    Medication List    TAKE these medications        acetaminophen 500 MG tablet  Commonly known as:  TYLENOL  Take 1,000 mg by mouth every 6 (six) hours as needed for moderate pain.     benazepril 10 MG tablet  Commonly known as:  LOTENSIN  Take 1 tablet (10 mg total) by mouth daily.     docusate sodium 100 MG capsule  Commonly known as:  COLACE  Take 1 capsule (100 mg total) by mouth 2 (two) times daily as needed for mild constipation.     ferrous sulfate 325 (65 FE) MG tablet  Take 1 tablet (325 mg total) by mouth 2 (two) times daily with a meal.     flintstones complete 60 MG chewable tablet  Chew 2 tablets by mouth daily.     promethazine 25 MG tablet  Commonly known as:  PHENERGAN  Take 0.5 tablets (12.5 mg total) by mouth every 6 (six) hours as needed for nausea or vomiting.        Diet: routine diet  Activity: Advance as tolerated. Pelvic rest for 6 weeks.  Follow up Appt:Future Appointments Date Time Provider Department Center  09/22/2015 10:45 AM Reva Boresanya S Pratt, MD [facility]    Postpartum contraception: Progesterone only pills  Newborn Data: Live born female  Birth Weight: 5 lb  15.1 oz (2695 g) APGAR: 8, 9  Baby Feeding: Bottle and Breast Disposition:home with mother   08/14/2015 Tereso NewcomerANYANWU,UGONNA A, MD

## 2015-08-15 ENCOUNTER — Ambulatory Visit: Payer: Self-pay

## 2015-08-15 NOTE — Lactation Note (Signed)
This note was copied from a baby's chart. Lactation Consultation Note  Parents report that baby is feeding better now. Mom is expressing about 40 ml of milk at each session now.  Encouraged her to feed the baby 20-30 ml at each feeding today and 30 or more tomorrow. Advised parents consult with pediatricians after discharge for further volume guidelines.   Reviewed supply and demand and a pumping schedule. Mom is concerned that her iron supplement my constipate the baby. Informed her that breast milk acts as a cathartic so is would help protect against constipation. Benefits of exclusive breast milk feeding for mom and baby were reviewed. Support groups and outpatient services encouraged as needed..  Patient Name: [name] [name] ZOXWR'UToday's Date: 08/15/2015 Reason for consult: Follow-up assessment   Maternal Data    Feeding Feeding Type: Bottle Fed - Formula Nipple Type: Slow - flow  LATCH Score/Interventions                      Lactation Tools Discussed/Used WIC Program: Yes   Consult Status      Soyla DryerJoseph, Darrelle Wiberg 08/15/2015, 10:10 AM

## 2015-08-16 ENCOUNTER — Ambulatory Visit: Payer: Self-pay

## 2015-08-16 NOTE — Lactation Note (Signed)
This note was copied from a baby's chart. Lactation Consultation Note  Patient Name: [name] JQZES'P Date: 08/16/2015 Reason for consult: Follow-up assessment;Infant < 6lbs;Other (Comment);Infant weight loss (early term - increased weight ,9% weight loss today , was at 10 % )  Per mom has been breast feeding some and bottle feeding EBM and formula. Per mom breast are full and milk is in. LC recommended working on latching and if baby won't latch , try giving her an EBM appetizer with bottle ( what she has got'en use to - 10 -15 ml and then latch at the breast. LC stressed not trying more than 10 -15  Mins. Or give a portion of her feeding form a bottle to start , energy will be higher and then latch. Watch for non - nutritive feeding patterns and hanging out.  LC had mom call WIC this am , per mom left a message, [place] faxed a [place] pump loaner request. Mom aware of the form being faxed and [place] should call her.  Sore nipple and engorgement prevention and tx reviewed. LC reminded mom to make sure she takes the entire pumping kit home.  LC also recommended when she is able to latch [place] in NICU to ask for lactation for feeding assessment.  Mother informed of post-discharge support and given phone number to the lactation department, including services for phone call assistance; out-patient appointments; and breastfeeding support group. List of other breastfeeding resources in the community given in the handout. Encouraged mother to call for problems or concerns related to breastfeeding.    Maternal Data Has patient been taught Hand Expression?: Yes (per mom feels very comfortable with technique )  Feeding Feeding Type:  (per mom recently fed around 8 am ) Nipple Type: Slow - flow  LATCH Score/Interventions                      Lactation Tools Discussed/Used WIC Program: Yes ([place] eencouraged mom to call . LC to Fax a request for a DEBP )   Consult Status Consult Status:  Follow-up Date: 08/23/15 [facility] [place] office at 1 pm , appt. reminder given to mom ) Follow-up type: Out-patient    Myer Haff 08/16/2015, 9:29 AM

## 2015-08-23 ENCOUNTER — Ambulatory Visit (HOSPITAL_COMMUNITY): Payer: Medicaid Other

## 2015-09-22 ENCOUNTER — Encounter: Payer: Self-pay | Admitting: Family Medicine

## 2015-09-22 ENCOUNTER — Ambulatory Visit (INDEPENDENT_AMBULATORY_CARE_PROVIDER_SITE_OTHER): Payer: Medicaid Other | Admitting: Family Medicine

## 2015-09-22 DIAGNOSIS — Z30011 Encounter for initial prescription of contraceptive pills: Secondary | ICD-10-CM

## 2015-09-22 MED ORDER — NORGESTIMATE-ETH ESTRADIOL 0.25-35 MG-MCG PO TABS: 1.0000 | ORAL_TABLET | Freq: Every day | ORAL | Status: DC

## 2015-09-22 NOTE — Patient Instructions (Signed)
Contraception Choices Contraception (birth control) is the use of any methods or devices to prevent pregnancy. Below are some methods to help avoid pregnancy. HORMONAL METHODS   Contraceptive implant. This is a thin, plastic tube containing progesterone hormone. It does not contain estrogen hormone. Your health care provider inserts the tube in the inner part of the upper arm. The tube can remain in place for up to 3 years. After 3 years, the implant must be removed. The implant prevents the ovaries from releasing an egg (ovulation), thickens the cervical mucus to prevent sperm from entering the uterus, and thins the lining of the inside of the uterus.  Progesterone-only injections. These injections are given every 3 months by your health care provider to prevent pregnancy. This synthetic progesterone hormone stops the ovaries from releasing eggs. It also thickens cervical mucus and changes the uterine lining. This makes it harder for sperm to survive in the uterus.  Birth control pills. These pills contain estrogen and progesterone hormone. They work by preventing the ovaries from releasing eggs (ovulation). They also cause the cervical mucus to thicken, preventing the sperm from entering the uterus. Birth control pills are prescribed by a health care provider.Birth control pills can also be used to treat heavy periods.  Minipill. This type of birth control pill contains only the progesterone hormone. They are taken every day of each month and must be prescribed by your health care provider.  Birth control patch. The patch contains hormones similar to those in birth control pills. It must be changed once a week and is prescribed by a health care provider.  Vaginal ring. The ring contains hormones similar to those in birth control pills. It is left in the vagina for 3 weeks, removed for 1 week, and then a new one is put back in place. The patient must be comfortable inserting and removing the ring  from the vagina.A health care provider's prescription is necessary.  Emergency contraception. Emergency contraceptives prevent pregnancy after unprotected sexual intercourse. This pill can be taken right after sex or up to 5 days after unprotected sex. It is most effective the sooner you take the pills after having sexual intercourse. Most emergency contraceptive pills are available without a prescription. Check with your pharmacist. Do not use emergency contraception as your only form of birth control. BARRIER METHODS   Female condom. This is a thin sheath (latex or rubber) that is worn over the penis during sexual intercourse. It can be used with spermicide to increase effectiveness.  Female condom. This is a soft, loose-fitting sheath that is put into the vagina before sexual intercourse.  Diaphragm. This is a soft, latex, dome-shaped barrier that must be fitted by a health care provider. It is inserted into the vagina, along with a spermicidal jelly. It is inserted before intercourse. The diaphragm should be left in the vagina for 6 to 8 hours after intercourse.  Cervical cap. This is a round, soft, latex or plastic cup that fits over the cervix and must be fitted by a health care provider. The cap can be left in place for up to 48 hours after intercourse.  Sponge. This is a soft, circular piece of polyurethane foam. The sponge has spermicide in it. It is inserted into the vagina after wetting it and before sexual intercourse.  Spermicides. These are chemicals that kill or block sperm from entering the cervix and uterus. They come in the form of creams, jellies, suppositories, foam, or tablets. They do not require a   prescription. They are inserted into the vagina with an applicator before having sexual intercourse. The process must be repeated every time you have sexual intercourse. INTRAUTERINE CONTRACEPTION  Intrauterine device (IUD). This is a T-shaped device that is put in a woman's uterus  during a menstrual period to prevent pregnancy. There are 2 types:  Copper IUD. This type of IUD is wrapped in copper wire and is placed inside the uterus. Copper makes the uterus and fallopian tubes produce a fluid that kills sperm. It can stay in place for 10 years.  Hormone IUD. This type of IUD contains the hormone progestin (synthetic progesterone). The hormone thickens the cervical mucus and prevents sperm from entering the uterus, and it also thins the uterine lining to prevent implantation of a fertilized egg. The hormone can weaken or kill the sperm that get into the uterus. It can stay in place for 3-5 years, depending on which type of IUD is used. PERMANENT METHODS OF CONTRACEPTION  Female tubal ligation. This is when the woman's fallopian tubes are surgically sealed, tied, or blocked to prevent the egg from traveling to the uterus.  Hysteroscopic sterilization. This involves placing a small coil or insert into each fallopian tube. Your doctor uses a technique called hysteroscopy to do the procedure. The device causes scar tissue to form. This results in permanent blockage of the fallopian tubes, so the sperm cannot fertilize the egg. It takes about 3 months after the procedure for the tubes to become blocked. You must use another form of birth control for these 3 months.  Female sterilization. This is when the female has the tubes that carry sperm tied off (vasectomy).This blocks sperm from entering the vagina during sexual intercourse. After the procedure, the man can still ejaculate fluid (semen). NATURAL PLANNING METHODS  Natural family planning. This is not having sexual intercourse or using a barrier method (condom, diaphragm, cervical cap) on days the woman could become pregnant.  Calendar method. This is keeping track of the length of each menstrual cycle and identifying when you are fertile.  Ovulation method. This is avoiding sexual intercourse during ovulation.  Symptothermal  method. This is avoiding sexual intercourse during ovulation, using a thermometer and ovulation symptoms.  Post-ovulation method. This is timing sexual intercourse after you have ovulated. Regardless of which type or method of contraception you choose, it is important that you use condoms to protect against the transmission of sexually transmitted infections (STIs). Talk with your health care provider about which form of contraception is most appropriate for you.   This information is not intended to replace advice given to you by your health care provider. Make sure you discuss any questions you have with your health care provider.   Document Released: 05/14/2005 Document Revised: 05/19/2013 Document Reviewed: 11/06/2012 Elsevier Interactive Patient Education 2016 Elsevier Inc.  

## 2015-09-22 NOTE — Progress Notes (Signed)
  Subjective:     [name] [name] is a 35 y.o. female who presents for a postpartum visit. She is 6 weeks postpartum following a spontaneous vaginal delivery. I have fully reviewed the prenatal and intrapartum course. The delivery was at 37 gestational weeks. Outcome: spontaneous vaginal delivery. Anesthesia: none. Postpartum course has been normal. Baby's course has been normal. Baby is feeding by both breast and bottle - Similac Neosure. Bleeding no bleeding. Bowel function is normal. Bladder function is normal. Patient is not sexually active. Contraception method is none. Postpartum depression screening: negative.  The following portions of the patient's history were reviewed and updated as appropriate: allergies, current medications, past family history, past medical history, past social history, past surgical history and problem list.  Review of Systems Pertinent items noted in HPI and remainder of comprehensive ROS otherwise negative.   Objective:    BP 137/94 mmHg  Pulse 73  Ht 5' 1.5" (1.562 m)  Wt 140 lb (63.504 kg)  BMI 26.03 kg/m2  LMP 09/22/2015  Breastfeeding? Yes  General:  alert, cooperative and appears stated age  Lungs: normal effort  Heart:  regular rate and rhythm  Abdomen: soft, non-tender; bowel sounds normal; no masses,  no organomegaly        Assessment:     Normal postpartum exam. Pap smear not done at today's visit.   Plan:    1. Contraception: OCP (estrogen/progesterone) 2. Pap WNL 11/16 3. Follow up in: 1 years or as needed.

## 2015-09-22 NOTE — Progress Notes (Deleted)
Patient ID: [name] DeistRayshonda D Lucado, female   DOB: August 25, 1980, 35 y.o.   MRN: [id number]

## 2015-09-26 ENCOUNTER — Telehealth: Payer: Self-pay | Admitting: *Deleted

## 2015-09-26 DIAGNOSIS — Z30011 Encounter for initial prescription of contraceptive pills: Secondary | ICD-10-CM

## 2015-09-26 MED ORDER — NORGESTIMATE-ETH ESTRADIOL 0.25-35 MG-MCG PO TABS: 1.0000 | ORAL_TABLET | Freq: Every day | ORAL | Status: DC

## 2015-09-26 NOTE — Telephone Encounter (Signed)
-----   Message from Olevia BowensJacinda S Battle sent at 09/26/2015  1:38 PM EDT ----- Regarding: Birth Control Contact: [phone] Wants to know if she can get her birth control in a 3 month supply U[facility] [address] [place]

## 2015-09-26 NOTE — Telephone Encounter (Signed)
Pt was rx birth control last week, requesting a 3 month supply to be sent to pharmacy.  Sent updated rx to the pharmacy.

## 2016-05-28 NOTE — L&D Delivery Note (Signed)
Delivery Note At 6:07 PM a viable and healthy female was delivered via Vaginal, Spontaneous Delivery (Presentation:ROA ;  ).  APGAR: 8, 9; weight pending .   Placenta status: intact , .  Cord: 3 vessel with the following complications: none.   Anesthesia:  none Episiotomy: None Lacerations: None  Est. Blood Loss (mL):  200  Mom to postpartum.  Baby to Couplet care / Skin to Skin.  Ignacia MarvelKendrick C Brittnay Pigman 12/30/2016, 6:16 PM

## 2016-11-05 ENCOUNTER — Encounter: Payer: Self-pay | Admitting: *Deleted

## 2016-11-05 ENCOUNTER — Ambulatory Visit (INDEPENDENT_AMBULATORY_CARE_PROVIDER_SITE_OTHER): Payer: Medicaid Other | Admitting: Family Medicine

## 2016-11-05 ENCOUNTER — Encounter: Payer: Self-pay | Admitting: Family Medicine

## 2016-11-05 VITALS — BP 130/82 | HR 101 | Resp 18 | Ht 61.5 in | Wt 138.0 lb

## 2016-11-05 DIAGNOSIS — Z113 Encounter for screening for infections with a predominantly sexual mode of transmission: Secondary | ICD-10-CM

## 2016-11-05 DIAGNOSIS — Z1151 Encounter for screening for human papillomavirus (HPV): Secondary | ICD-10-CM

## 2016-11-05 DIAGNOSIS — O0940 Supervision of pregnancy with grand multiparity, unspecified trimester: Secondary | ICD-10-CM | POA: Insufficient documentation

## 2016-11-05 DIAGNOSIS — Z01419 Encounter for gynecological examination (general) (routine) without abnormal findings: Secondary | ICD-10-CM

## 2016-11-05 DIAGNOSIS — Z862 Personal history of diseases of the blood and blood-forming organs and certain disorders involving the immune mechanism: Secondary | ICD-10-CM

## 2016-11-05 DIAGNOSIS — O0930 Supervision of pregnancy with insufficient antenatal care, unspecified trimester: Secondary | ICD-10-CM | POA: Insufficient documentation

## 2016-11-05 DIAGNOSIS — O0990 Supervision of high risk pregnancy, unspecified, unspecified trimester: Secondary | ICD-10-CM | POA: Insufficient documentation

## 2016-11-05 DIAGNOSIS — Z8759 Personal history of other complications of pregnancy, childbirth and the puerperium: Secondary | ICD-10-CM

## 2016-11-05 DIAGNOSIS — Z01411 Encounter for gynecological examination (general) (routine) with abnormal findings: Secondary | ICD-10-CM

## 2016-11-05 DIAGNOSIS — O99019 Anemia complicating pregnancy, unspecified trimester: Secondary | ICD-10-CM | POA: Insufficient documentation

## 2016-11-05 DIAGNOSIS — Z331 Pregnant state, incidental: Secondary | ICD-10-CM

## 2016-11-05 DIAGNOSIS — Z124 Encounter for screening for malignant neoplasm of cervix: Secondary | ICD-10-CM

## 2016-11-05 MED ORDER — PRENATAL VITAMINS 0.8 MG PO TABS: 1.0000 | ORAL_TABLET | Freq: Every day | ORAL | 12 refills | Status: DC

## 2016-11-05 NOTE — Patient Instructions (Signed)
Preventive Care 18-39 Years, Female Preventive care refers to lifestyle choices and visits with your health care provider that can promote health and wellness. What does preventive care include?  A yearly physical exam. This is also called an annual well check.  Dental exams once or twice a year.  Routine eye exams. Ask your health care provider how often you should have your eyes checked.  Personal lifestyle choices, including: ? Daily care of your teeth and gums. ? Regular physical activity. ? Eating a healthy diet. ? Avoiding tobacco and drug use. ? Limiting alcohol use. ? Practicing safe sex. ? Taking vitamin and mineral supplements as recommended by your health care provider. What happens during an annual well check? The services and screenings done by your health care provider during your annual well check will depend on your age, overall health, lifestyle risk factors, and family history of disease. Counseling Your health care provider may ask you questions about your:  Alcohol use.  Tobacco use.  Drug use.  Emotional well-being.  Home and relationship well-being.  Sexual activity.  Eating habits.  Work and work Statistician.  Method of birth control.  Menstrual cycle.  Pregnancy history.  Screening You may have the following tests or measurements:  Height, weight, and BMI.  Diabetes screening. This is done by checking your blood sugar (glucose) after you have not eaten for a while (fasting).  Blood pressure.  Lipid and cholesterol levels. These may be checked every 5 years starting at age 66.  Skin check.  Hepatitis C blood test.  Hepatitis B blood test.  Sexually transmitted disease (STD) testing.  BRCA-related cancer screening. This may be done if you have a family history of breast, ovarian, tubal, or peritoneal cancers.  Pelvic exam and Pap test. This may be done every 3 years starting at age 40. Starting at age 59, this may be done every 5  years if you have a Pap test in combination with an HPV test.  Discuss your test results, treatment options, and if necessary, the need for more tests with your health care provider. Vaccines Your health care provider may recommend certain vaccines, such as:  Influenza vaccine. This is recommended every year.  Tetanus, diphtheria, and acellular pertussis (Tdap, Td) vaccine. You may need a Td booster every 10 years.  Varicella vaccine. You may need this if you have not been vaccinated.  HPV vaccine. If you are 69 or younger, you may need three doses over 6 months.  Measles, mumps, and rubella (MMR) vaccine. You may need at least one dose of MMR. You may also need a second dose.  Pneumococcal 13-valent conjugate (PCV13) vaccine. You may need this if you have certain conditions and were not previously vaccinated.  Pneumococcal polysaccharide (PPSV23) vaccine. You may need one or two doses if you smoke cigarettes or if you have certain conditions.  Meningococcal vaccine. One dose is recommended if you are age 27-21 years and a first-year college student living in a residence hall, or if you have one of several medical conditions. You may also need additional booster doses.  Hepatitis A vaccine. You may need this if you have certain conditions or if you travel or work in places where you may be exposed to hepatitis A.  Hepatitis B vaccine. You may need this if you have certain conditions or if you travel or work in places where you may be exposed to hepatitis B.  Haemophilus influenzae type b (Hib) vaccine. You may need this if  you have certain risk factors.  Talk to your health care provider about which screenings and vaccines you need and how often you need them. This information is not intended to replace advice given to you by your health care provider. Make sure you discuss any questions you have with your health care provider. Document Released: 07/10/2001 Document Revised: 02/01/2016  Document Reviewed: 03/15/2015 Elsevier Interactive Patient Education  2017 [employer].

## 2016-11-05 NOTE — Progress Notes (Signed)
  Subjective:     [name] [name] is a 36 y.o. female and is here for a comprehensive physical exam. The patient reports problems - no menses since December, abdominal bloating and constipation.  Social History   Social History  . Marital status: Single    Spouse name: N/A  . Number of children: N/A  . Years of education: N/A   Occupational History  . Not on file.   Social History Main Topics  . Smoking status: Former Smoker    Packs/day: 0.50    Types: Cigarettes    Quit date: 11/05/2016  . Smokeless tobacco: Never Used  . Alcohol use Yes     Comment: not since pregnancy  . Drug use: No  . Sexual activity: Yes    Birth control/ protection: None   Other Topics Concern  . Not on file   Social History Narrative  . No narrative on file   Health Maintenance  Topic Date Due  . TETANUS/TDAP  01/24/2002  . INFLUENZA VACCINE  12/26/2016  . PAP SMEAR  04/10/2018  . HIV Screening  Completed    The following portions of the patient's history were reviewed and updated as appropriate: allergies, current medications, past family history, past medical history, past social history, past surgical history and problem list.  Review of Systems Pertinent items are noted in HPI.   Objective:    BP 130/82   Pulse (!) 101   Resp 18   Ht 5' 1.5" (1.562 m)   Wt 138 lb (62.6 kg)   LMP 05/07/2016   BMI 25.65 kg/m  General appearance: alert, cooperative and appears stated age Head: Normocephalic, without obvious abnormality, atraumatic Neck: no adenopathy, supple, symmetrical, trachea midline and thyroid not enlarged, symmetric, no tenderness/mass/nodules Lungs: clear to auscultation bilaterally Breasts: normal appearance, no masses or tenderness Heart: regular rate and rhythm, S1, S2 normal, no murmur, click, rub or gallop Abdomen: soft, non-tender; bowel sounds normal; no masses,  no organomegaly Pelvic: cervix normal in appearance, external genitalia normal, no adnexal masses  or tenderness, no cervical motion tenderness, vagina normal without discharge and uterus with fundal height of 29 cm Extremities: extremities normal, atraumatic, no cyanosis or edema Pulses: 2+ and symmetric Skin: Skin color, texture, turgor normal. No rashes or lesions Lymph nodes: Cervical, supraclavicular, and axillary nodes normal. Neurologic: Grossly normal    UPT +  Abd u/s shows SIUP FHR 158, EGA 28 2/7 wks Assessment:    Healthy female exam.      Plan:   Problem List Items Addressed This Visit    None    Visit Diagnoses    Encounter for gynecological examination with abnormal finding    -  Primary   Relevant Orders   Cytology - PAP   Pregnant state, incidental       Relevant Medications   Prenatal Multivit-Min-Fe-FA (PRENATAL VITAMINS) 0.8 MG tablet   Other Relevant Orders   Culture, OB Urine   Obstetric Panel, Including HIV   US MFM OB DETAIL +14 WK     Will check labs and schedule u/s--will not need CPE at [facility] and most work will be done. Begin PNV's. Too late for genetics, ASA. Will need to come fasting for glucose screening next visit.  Return in 2 weeks (on 11/19/2016) for ob visit, 28 wk labs.    See After Visit Summary for Counseling Recommendations

## 2016-11-05 NOTE — Progress Notes (Signed)
Normal pap 03/2015

## 2016-11-05 NOTE — Progress Notes (Signed)
Pt was here today for annual exam. C/O amenorrhea since December 2017, bloating, and constipation.  UPT +, bedside US performed and SIUP noted with + FHR = 150 and average US measurement of HC/AC/FL = 28w 2d.

## 2016-11-06 LAB — OBSTETRIC PANEL, INCLUDING HIV
Antibody Screen: NEGATIVE
Basophils Absolute: 0 10*3/uL (ref 0.0–0.2)
Basos: 0 %
EOS (ABSOLUTE): 0.2 10*3/uL (ref 0.0–0.4)
Eos: 1 %
HIV Screen 4th Generation wRfx: NONREACTIVE
Hematocrit: 28.5 % — ABNORMAL LOW (ref 34.0–46.6)
Hemoglobin: 9.6 g/dL — ABNORMAL LOW (ref 11.1–15.9)
Hepatitis B Surface Ag: NEGATIVE
Immature Grans (Abs): 0.1 10*3/uL (ref 0.0–0.1)
Immature Granulocytes: 1 %
Lymphocytes Absolute: 3.6 10*3/uL — ABNORMAL HIGH (ref 0.7–3.1)
Lymphs: 25 %
MCH: 29.6 pg (ref 26.6–33.0)
MCHC: 33.7 g/dL (ref 31.5–35.7)
MCV: 88 fL (ref 79–97)
Monocytes Absolute: 1 10*3/uL — ABNORMAL HIGH (ref 0.1–0.9)
Monocytes: 7 %
Neutrophils Absolute: 9.8 10*3/uL — ABNORMAL HIGH (ref 1.4–7.0)
Neutrophils: 66 %
Platelets: 337 10*3/uL (ref 150–379)
RBC: 3.24 x10E6/uL — ABNORMAL LOW (ref 3.77–5.28)
RDW: 14.2 % (ref 12.3–15.4)
RPR Ser Ql: NONREACTIVE
Rh Factor: POSITIVE
Rubella Antibodies, IGG: 18.5 {index}
WBC: 14.7 10*3/uL — ABNORMAL HIGH (ref 3.4–10.8)

## 2016-11-07 LAB — URINE CULTURE, OB REFLEX

## 2016-11-07 LAB — CULTURE, OB URINE

## 2016-11-08 LAB — CYTOLOGY - PAP
ADEQUACY: ABSENT
Chlamydia: NEGATIVE
DIAGNOSIS: NEGATIVE
HPV 16/18/45 genotyping: NEGATIVE
HPV: DETECTED — AB
NEISSERIA GONORRHEA: NEGATIVE

## 2016-11-12 ENCOUNTER — Encounter: Payer: Self-pay | Admitting: Family Medicine

## 2016-11-12 DIAGNOSIS — R8789 Other abnormal findings in specimens from female genital organs: Secondary | ICD-10-CM | POA: Insufficient documentation

## 2016-11-12 DIAGNOSIS — R87618 Other abnormal cytological findings on specimens from cervix uteri: Secondary | ICD-10-CM | POA: Insufficient documentation

## 2016-11-21 ENCOUNTER — Other Ambulatory Visit: Payer: Self-pay | Admitting: Family Medicine

## 2016-11-21 ENCOUNTER — Ambulatory Visit (HOSPITAL_COMMUNITY)
Admission: RE | Admit: 2016-11-21 | Discharge: 2016-11-21 | Disposition: A | Discharge status: Home / Self Care | Payer: PRIVATE HEALTH INSURANCE | Source: Ambulatory Visit | Attending: Family Medicine | Admitting: Family Medicine

## 2016-11-21 DIAGNOSIS — Z331 Pregnant state, incidental: Secondary | ICD-10-CM

## 2016-11-21 DIAGNOSIS — Z363 Encounter for antenatal screening for malformations: Secondary | ICD-10-CM

## 2016-11-21 DIAGNOSIS — Z3687 Encounter for antenatal screening for uncertain dates: Secondary | ICD-10-CM | POA: Insufficient documentation

## 2016-11-21 DIAGNOSIS — O0933 Supervision of pregnancy with insufficient antenatal care, third trimester: Secondary | ICD-10-CM | POA: Insufficient documentation

## 2016-11-21 DIAGNOSIS — Z3A32 32 weeks gestation of pregnancy: Secondary | ICD-10-CM | POA: Insufficient documentation

## 2016-11-27 ENCOUNTER — Encounter: Payer: PRIVATE HEALTH INSURANCE | Admitting: Family Medicine

## 2016-12-04 ENCOUNTER — Encounter: Payer: PRIVATE HEALTH INSURANCE | Admitting: Obstetrics and Gynecology

## 2016-12-30 ENCOUNTER — Inpatient Hospital Stay (HOSPITAL_COMMUNITY)
Admission: AD | Admit: 2016-12-30 | Discharge: 2017-01-02 | DRG: 775 | Discharge status: Against Medical Advice | Payer: Medicaid Other | Source: Ambulatory Visit | Attending: Obstetrics & Gynecology | Admitting: Obstetrics & Gynecology

## 2016-12-30 ENCOUNTER — Encounter (HOSPITAL_COMMUNITY): Payer: Self-pay | Admitting: *Deleted

## 2016-12-30 DIAGNOSIS — Z8759 Personal history of other complications of pregnancy, childbirth and the puerperium: Secondary | ICD-10-CM

## 2016-12-30 DIAGNOSIS — Z87891 Personal history of nicotine dependence: Secondary | ICD-10-CM

## 2016-12-30 DIAGNOSIS — O1414 Severe pre-eclampsia complicating childbirth: Principal | ICD-10-CM | POA: Diagnosis present

## 2016-12-30 DIAGNOSIS — O163 Unspecified maternal hypertension, third trimester: Secondary | ICD-10-CM

## 2016-12-30 DIAGNOSIS — O9902 Anemia complicating childbirth: Secondary | ICD-10-CM | POA: Diagnosis present

## 2016-12-30 DIAGNOSIS — Z3A38 38 weeks gestation of pregnancy: Secondary | ICD-10-CM

## 2016-12-30 DIAGNOSIS — O0933 Supervision of pregnancy with insufficient antenatal care, third trimester: Secondary | ICD-10-CM

## 2016-12-30 DIAGNOSIS — E876 Hypokalemia: Secondary | ICD-10-CM | POA: Diagnosis present

## 2016-12-30 DIAGNOSIS — R87618 Other abnormal cytological findings on specimens from cervix uteri: Secondary | ICD-10-CM

## 2016-12-30 DIAGNOSIS — O0940 Supervision of pregnancy with grand multiparity, unspecified trimester: Secondary | ICD-10-CM

## 2016-12-30 DIAGNOSIS — D573 Sickle-cell trait: Secondary | ICD-10-CM | POA: Diagnosis present

## 2016-12-30 DIAGNOSIS — R8789 Other abnormal findings in specimens from female genital organs: Secondary | ICD-10-CM

## 2016-12-30 DIAGNOSIS — O0990 Supervision of high risk pregnancy, unspecified, unspecified trimester: Secondary | ICD-10-CM

## 2016-12-30 DIAGNOSIS — Z3493 Encounter for supervision of normal pregnancy, unspecified, third trimester: Secondary | ICD-10-CM | POA: Diagnosis present

## 2016-12-30 LAB — COMPREHENSIVE METABOLIC PANEL
ALBUMIN: 2.7 g/dL — AB (ref 3.5–5.0)
ALK PHOS: 105 U/L (ref 38–126)
ALT: 20 U/L (ref 14–54)
AST: 25 U/L (ref 15–41)
Anion gap: 13 (ref 5–15)
CALCIUM: 8 mg/dL — AB (ref 8.9–10.3)
CHLORIDE: 93 mmol/L — AB (ref 101–111)
CO2: 31 mmol/L (ref 22–32)
CREATININE: 0.61 mg/dL (ref 0.44–1.00)
GFR calc Af Amer: >60 mL/min (ref >60)
GFR calc non Af Amer: >60 mL/min (ref >60)
GLUCOSE: 84 mg/dL (ref 65–99)
Potassium: 2.3 mmol/L — CL (ref 3.5–5.1)
SODIUM: 137 mmol/L (ref 135–145)
Total Bilirubin: 0.9 mg/dL (ref 0.3–1.2)
Total Protein: 6.5 g/dL (ref 6.5–8.1)

## 2016-12-30 LAB — CBC
HCT: 29.8 % — ABNORMAL LOW (ref 36.0–46.0)
HEMOGLOBIN: 10.1 g/dL — AB (ref 12.0–15.0)
MCH: 26.9 pg (ref 26.0–34.0)
MCHC: 33.9 g/dL (ref 30.0–36.0)
MCV: 79.5 fL (ref 78.0–100.0)
PLATELETS: 332 10*3/uL (ref 150–400)
RBC: 3.75 MIL/uL — AB (ref 3.87–5.11)
RDW: 16.2 % — ABNORMAL HIGH (ref 11.5–15.5)
WBC: 15.9 10*3/uL — AB (ref 4.0–10.5)

## 2016-12-30 LAB — PROTEIN / CREATININE RATIO, URINE
Creatinine, Urine: 55 mg/dL
PROTEIN CREATININE RATIO: 1.89 mg/mg{creat} — AB (ref 0.00–0.15)
TOTAL PROTEIN, URINE: 104 mg/dL

## 2016-12-30 LAB — RAPID URINE DRUG SCREEN, HOSP PERFORMED
Amphetamines: NOT DETECTED
Barbiturates: NOT DETECTED
Benzodiazepines: NOT DETECTED
Cocaine: NOT DETECTED
OPIATES: NOT DETECTED
Tetrahydrocannabinol: POSITIVE — AB

## 2016-12-30 LAB — GROUP B STREP BY PCR: GROUP B STREP BY PCR: NEGATIVE

## 2016-12-30 LAB — TYPE AND SCREEN
ABO/RH(D): A POS
Antibody Screen: NEGATIVE

## 2016-12-30 MED ORDER — OXYCODONE-ACETAMINOPHEN 5-325 MG PO TABS
2.0000 | ORAL_TABLET | ORAL | Status: DC | PRN
Start: 2016-12-30 — End: 2016-12-30

## 2016-12-30 MED ORDER — SODIUM CHLORIDE 0.9% FLUSH
3.0000 mL | Freq: Two times a day (BID) | INTRAVENOUS | Status: DC
  Administered 2017-01-02 (×2): 3 mL via INTRAVENOUS

## 2016-12-30 MED ORDER — ONDANSETRON HCL 4 MG PO TABS: 4.0000 mg | ORAL_TABLET | ORAL | Status: DC | PRN

## 2016-12-30 MED ORDER — POTASSIUM CHLORIDE CRYS ER 20 MEQ PO TBCR
40.0000 meq | EXTENDED_RELEASE_TABLET | Freq: Two times a day (BID) | ORAL | Status: AC
  Administered 2016-12-30 – 2017-01-02 (×6): 40 meq via ORAL
  Filled 2016-12-30 (×6): qty 2

## 2016-12-30 MED ORDER — SOD CITRATE-CITRIC ACID 500-334 MG/5ML PO SOLN: 30.0000 mL | ORAL | Status: DC | PRN

## 2016-12-30 MED ORDER — ONDANSETRON HCL 4 MG/2ML IJ SOLN: 4.0000 mg | INTRAMUSCULAR | Status: DC | PRN

## 2016-12-30 MED ORDER — OXYTOCIN BOLUS FROM INFUSION
500.0000 mL | Freq: Once | INTRAVENOUS | Status: AC
  Administered 2016-12-30: 500 mL via INTRAVENOUS

## 2016-12-30 MED ORDER — BENZOCAINE-MENTHOL 20-0.5 % EX AERO: 1.0000 "application " | INHALATION_SPRAY | CUTANEOUS | Status: DC | PRN

## 2016-12-30 MED ORDER — ZOLPIDEM TARTRATE 5 MG PO TABS: 5.0000 mg | ORAL_TABLET | Freq: Every evening | ORAL | Status: DC | PRN

## 2016-12-30 MED ORDER — COCONUT OIL OIL: 1.0000 "application " | TOPICAL_OIL | Status: DC | PRN

## 2016-12-30 MED ORDER — ACETAMINOPHEN 325 MG PO TABS: 650.0000 mg | ORAL_TABLET | ORAL | Status: DC | PRN

## 2016-12-30 MED ORDER — HYDRALAZINE HCL 20 MG/ML IJ SOLN
INTRAMUSCULAR | Status: AC
  Filled 2016-12-30: qty 1

## 2016-12-30 MED ORDER — OXYTOCIN 40 UNITS IN LACTATED RINGERS INFUSION - SIMPLE MED
2.5000 [IU]/h | INTRAVENOUS | Status: DC
  Administered 2016-12-30: 2.5 [IU]/h via INTRAVENOUS

## 2016-12-30 MED ORDER — LACTATED RINGERS IV SOLN
INTRAVENOUS | Status: DC
  Administered 2016-12-30 – 2016-12-31 (×3): via INTRAVENOUS

## 2016-12-30 MED ORDER — SODIUM CHLORIDE 0.9 % IV SOLN: 250.0000 mL | INTRAVENOUS | Status: DC | PRN

## 2016-12-30 MED ORDER — DIBUCAINE 1 % RE OINT: 1.0000 "application " | TOPICAL_OINTMENT | RECTAL | Status: DC | PRN

## 2016-12-30 MED ORDER — IBUPROFEN 100 MG/5ML PO SUSP
600.0000 mg | Freq: Four times a day (QID) | ORAL | Status: DC
  Administered 2016-12-31 – 2017-01-02 (×6): 600 mg via ORAL
  Filled 2016-12-30 (×15): qty 30

## 2016-12-30 MED ORDER — SENNOSIDES-DOCUSATE SODIUM 8.6-50 MG PO TABS
2.0000 | ORAL_TABLET | ORAL | Status: DC
  Administered 2016-12-30 – 2016-12-31 (×2): 2 via ORAL
  Filled 2016-12-30 (×3): qty 2

## 2016-12-30 MED ORDER — MAGNESIUM SULFATE 40 G IN LACTATED RINGERS - SIMPLE
2.0000 g/h | INTRAVENOUS | Status: DC
  Administered 2016-12-31: 2 g/h via INTRAVENOUS
  Filled 2016-12-30: qty 500
  Filled 2016-12-30: qty 40

## 2016-12-30 MED ORDER — IBUPROFEN 600 MG PO TABS
600.0000 mg | ORAL_TABLET | Freq: Four times a day (QID) | ORAL | Status: DC
  Administered 2016-12-30: 600 mg via ORAL
  Filled 2016-12-30: qty 1

## 2016-12-30 MED ORDER — WITCH HAZEL-GLYCERIN EX PADS: 1.0000 "application " | MEDICATED_PAD | CUTANEOUS | Status: DC | PRN

## 2016-12-30 MED ORDER — TETANUS-DIPHTH-ACELL PERTUSSIS 5-2.5-18.5 LF-MCG/0.5 IM SUSP: 0.5000 mL | Freq: Once | INTRAMUSCULAR | Status: DC

## 2016-12-30 MED ORDER — LIDOCAINE HCL (PF) 1 % IJ SOLN
INTRAMUSCULAR | Status: AC
  Filled 2016-12-30: qty 30

## 2016-12-30 MED ORDER — POTASSIUM CHLORIDE 20 MEQ PO PACK: 40.0000 meq | PACK | Freq: Two times a day (BID) | ORAL | Status: DC

## 2016-12-30 MED ORDER — HYDRALAZINE HCL 20 MG/ML IJ SOLN
10.0000 mg | Freq: Once | INTRAMUSCULAR | Status: AC
  Administered 2016-12-30: 10 mg via INTRAMUSCULAR

## 2016-12-30 MED ORDER — LIDOCAINE HCL (PF) 1 % IJ SOLN
30.0000 mL | INTRAMUSCULAR | Status: DC | PRN
  Filled 2016-12-30: qty 30

## 2016-12-30 MED ORDER — LABETALOL HCL 5 MG/ML IV SOLN
20.0000 mg | INTRAVENOUS | Status: AC | PRN
  Administered 2017-01-01 – 2017-01-02 (×2): 20 mg via INTRAVENOUS
  Filled 2016-12-30 (×2): qty 4

## 2016-12-30 MED ORDER — HYDRALAZINE HCL 20 MG/ML IJ SOLN
5.0000 mg | INTRAMUSCULAR | Status: AC | PRN
  Administered 2016-12-31: 10 mg via INTRAVENOUS
  Administered 2016-12-31: 5 mg via INTRAVENOUS
  Filled 2016-12-30 (×2): qty 1

## 2016-12-30 MED ORDER — LACTATED RINGERS IV SOLN: 500.0000 mL | INTRAVENOUS | Status: DC | PRN

## 2016-12-30 MED ORDER — MEASLES, MUMPS & RUBELLA VAC ~~LOC~~ INJ: 0.5000 mL | INJECTION | Freq: Once | SUBCUTANEOUS | Status: DC

## 2016-12-30 MED ORDER — OXYCODONE-ACETAMINOPHEN 5-325 MG PO TABS: 1.0000 | ORAL_TABLET | ORAL | Status: DC | PRN

## 2016-12-30 MED ORDER — MAGNESIUM SULFATE BOLUS VIA INFUSION
4.0000 g | Freq: Once | INTRAVENOUS | Status: AC
  Administered 2016-12-30: 4 g via INTRAVENOUS
  Filled 2016-12-30: qty 500

## 2016-12-30 MED ORDER — PRENATAL MULTIVITAMIN CH
1.0000 | ORAL_TABLET | Freq: Every day | ORAL | Status: DC
  Administered 2016-12-31: 1 via ORAL
  Filled 2016-12-30: qty 1

## 2016-12-30 MED ORDER — DIPHENHYDRAMINE HCL 25 MG PO CAPS: 25.0000 mg | ORAL_CAPSULE | Freq: Four times a day (QID) | ORAL | Status: DC | PRN

## 2016-12-30 MED ORDER — ONDANSETRON HCL 4 MG/2ML IJ SOLN: 4.0000 mg | Freq: Four times a day (QID) | INTRAMUSCULAR | Status: DC | PRN

## 2016-12-30 MED ORDER — HYDRALAZINE HCL 20 MG/ML IJ SOLN
10.0000 mg | Freq: Once | INTRAMUSCULAR | Status: AC
  Administered 2016-12-30: 10 mg via INTRAVENOUS
  Filled 2016-12-30: qty 1

## 2016-12-30 MED ORDER — SODIUM CHLORIDE 0.9% FLUSH: 3.0000 mL | INTRAVENOUS | Status: DC | PRN

## 2016-12-30 MED ORDER — SIMETHICONE 80 MG PO CHEW: 80.0000 mg | CHEWABLE_TABLET | ORAL | Status: DC | PRN

## 2016-12-30 MED ORDER — ACETAMINOPHEN 325 MG PO TABS
650.0000 mg | ORAL_TABLET | ORAL | Status: DC | PRN
  Administered 2017-01-01: 650 mg via ORAL
  Filled 2016-12-30: qty 2

## 2016-12-30 NOTE — Progress Notes (Signed)
19:54-Notified by lab of critical potassium level of 2.3  19:58- Dr. Macon LargeAnyanwu notified of critical potassium level.            States she will input orders.

## 2016-12-30 NOTE — H&P (Signed)
[name] DeistRayshonda D Kawahara is a 36 y.o. female [id number] @ 38.1 wks presenting for active labor. OB History    Gravida Para Term Preterm AB Living   7 5 5   1 5    SAB TAB Ectopic Multiple Live Births         0 5     Past Medical History:  Diagnosis Date  . Ovarian cyst   . Sickle cell trait [facility](HCC)    Past Surgical History:  Procedure Laterality Date  . INDUCED ABORTION     Family History: family history includes Breast cancer in her maternal grandmother and mother; Diabetes in her mother. Social History:  reports that she quit smoking about 7 weeks ago. Her smoking use included Cigarettes. She smoked 0.50 packs per day. She has never used smokeless tobacco. She reports that she drinks alcohol. She reports that she does not use drugs.     Maternal Diabetes: No Genetic Screening: Normal Maternal Ultrasounds/Referrals: Normal Fetal Ultrasounds or other Referrals:  None Maternal Substance Abuse:  No Significant Maternal Medications:  None Significant Maternal Lab Results:  None Other Comments:  None  Review of Systems  Constitutional: Negative.   HENT: Negative.   Eyes: Negative.   Respiratory: Negative.   Cardiovascular: Negative.   Gastrointestinal: Positive for abdominal pain.  Genitourinary: Negative.   Musculoskeletal: Negative.   Skin: Negative.   Neurological: Negative.   Endo/Heme/Allergies: Negative.   Psychiatric/Behavioral: Negative.    Maternal Medical History:  Reason for admission: Contractions.   Contractions: Onset was 13-24 hours ago.   Frequency: regular.   Perceived severity is strong.    Fetal activity: Perceived fetal activity is normal.   Last perceived fetal movement was within the past hour.    Prenatal complications: Pre-eclampsia.   Prenatal Complications - Diabetes: none.    Dilation: 8 Effacement (%): 100 Station: -1 Exam by:: Christina robinson RNC Blood pressure (!) 171/101, pulse 95, resp. rate 20, last menstrual period 05/01/2016, SpO2  98 %, currently breastfeeding. Maternal Exam:  Uterine Assessment: Contraction strength is firm.  Contraction frequency is regular.   Abdomen: Patient reports no abdominal tenderness. Fetal presentation: vertex  Introitus: Normal vulva. Normal vagina.  Ferning test: not done.  Amniotic fluid character: clear.  Pelvis: adequate for delivery.   Cervix: Cervix evaluated by digital exam.     Fetal Exam Fetal Monitor Review: Mode: ultrasound.   Variability: moderate (6-25 bpm).   Pattern: accelerations present.    Fetal State Assessment: Category I - tracings are normal.     Physical Exam  Constitutional: She is oriented to person, place, and time. She appears well-developed and well-nourished.  HENT:  Head: Normocephalic.  Eyes: Pupils are equal, round, and reactive to light.  Neck: Normal range of motion.  Cardiovascular: Normal rate, regular rhythm, normal heart sounds and intact distal pulses.   Respiratory: Effort normal and breath sounds normal.  GI: Soft. Bowel sounds are normal.  Genitourinary: Vagina normal and uterus normal.  Musculoskeletal: Normal range of motion.  Neurological: She is alert and oriented to person, place, and time. She has normal reflexes.  Skin: Skin is warm and dry.  Psychiatric: She has a normal mood and affect. Her behavior is normal. Judgment and thought content normal.    Prenatal labs: ABO, Rh: A/Positive/-- (06/11 1511) Antibody: Negative (06/11 1511) Rubella: 18.50 (06/11 1511) RPR: Non Reactive (06/11 1511)  HBsAg: Negative (06/11 1511)  HIV:    GBS:     Assessment/Plan: Active labor impending delivery admit.Marland Kitchen..Marland Kitchen  Wyvonnia DuskyMarie Asencion Guisinger 12/30/2016, 6:17 PM

## 2016-12-30 NOTE — MAU Note (Signed)
+  contractions Started at 2am this morning 2-3 mins apart  Denies LOF or VB  +FM  No recent VE

## 2016-12-31 LAB — RPR: RPR Ser Ql: NONREACTIVE

## 2016-12-31 MED ORDER — AMLODIPINE BESYLATE 10 MG PO TABS
10.0000 mg | ORAL_TABLET | Freq: Every day | ORAL | Status: DC
Start: 2016-12-31 — End: 2017-01-02
  Administered 2016-12-31 – 2017-01-02 (×3): 10 mg via ORAL
  Filled 2016-12-31 (×3): qty 1

## 2016-12-31 NOTE — Progress Notes (Addendum)
CSW acknowledges consult.  CSW attempted to meet with MOB, however MOB is currently receiving Mag.   CSW will attempt to visit with MOB on tomorrow (01/01/17).  Blaine HamperAngel Boyd-Gilyard, MSW, LCSW Clinical Social Work [phone]

## 2016-12-31 NOTE — Progress Notes (Signed)
Post Partum Day 1 Subjective: Pt without complaints this morning. Denies HA or visual changes. Ambulating and voiding without problems. Tolerating diet. Good pain control. Bottle feeding.  Objective: Blood pressure (!) 155/97, pulse 90, temperature 98.3 F (36.8 C), temperature source Oral, resp. rate 18, height 5' 1.5" (1.562 m), weight 53.5 kg (118 lb), last menstrual period 05/01/2016, SpO2 96 %, unknown if currently breastfeeding.  Physical Exam:  General: no distress Lochia: appropriate Uterine Fundus: firm Incision: NA DVT Evaluation: No evidence of DVT seen on physical exam.   Recent Labs  12/30/16 1756  HGB 10.1*  HCT 29.8*    Assessment/Plan: PPD # 1 TSVD Severe PEC Hypokalemia No PNC  BP still elevated. Will start Norvasc. H/O HTN/PEC with last pregnancy. Continue with magnesium, off this evening. Continue to replace K. Check BMP in AM. Considering Depo Provera. SW consult d/t no PNC.    LOS: 1 day   [name] StaggersMichael L Andie [name] 12/31/2016, 9:16 AM

## 2016-12-31 NOTE — Plan of Care (Signed)
Problem: Life Cycle: Goal: Risk for postpartum hemorrhage will decrease Outcome: Progressing Continuing to monitor fundal height and bleeding.  Scant amount of bleeding.  Fundus firm and 2 below.    Problem: Nutritional: Goal: Mothers verbalization of comfort with breastfeeding process will improve Outcome: Not Applicable Date Met: 24/46/28 Patient is not breastfeeding.    Problem: Role Relationship: Goal: Ability to demonstrate positive interaction with newborn will improve Outcome: Progressing Patient incorporating newborn into family system appropriately.    Problem: Pain Management: Goal: General experience of comfort will improve and pain level will decrease Outcome: Progressing Patient denies pain at this time.

## 2016-12-31 NOTE — Plan of Care (Signed)
Problem: Education: Goal: Knowledge of disease or condition will improve Outcome: Progressing Educated patient regarding signs and symptoms of hypertension.  Discussed headache, epigastric pain, blurred vision and spots.  Continuing to monitor blood pressures.

## 2016-12-31 NOTE — Progress Notes (Signed)
This call/notification was done for another RN, Addi Pugh who is occupied at this time. Notified Dr. Erin FullingHarraway-Smith of pts elevated BP at 1200, medication given, and then BP rechecked at 1330 was 149/95. Pt does state that she is seeing spots, and MD is aware of this. MD states that due to IV med and scheduled BP med that was given this morning, she does not desire for us to do anything else at this time, other than recheck BP in 1 hour. Will pass this along to pts RN. Sheryn BisonGordon, Geraldin Habermehl Warner

## 2016-12-31 NOTE — Progress Notes (Signed)
Harraway/Smith MD notifed of bps today ordered to d/c mag. infusion

## 2017-01-01 ENCOUNTER — Encounter (HOSPITAL_COMMUNITY): Payer: Self-pay

## 2017-01-01 LAB — BASIC METABOLIC PANEL
Anion gap: 8 (ref 5–15)
BUN: 6 mg/dL (ref 6–20)
CALCIUM: 8.1 mg/dL — AB (ref 8.9–10.3)
CO2: 25 mmol/L (ref 22–32)
Chloride: 104 mmol/L (ref 101–111)
Creatinine, Ser: 0.71 mg/dL (ref 0.44–1.00)
GFR calc Af Amer: >60 mL/min (ref >60)
Glucose, Bld: 87 mg/dL (ref 65–99)
POTASSIUM: 3.1 mmol/L — AB (ref 3.5–5.1)
SODIUM: 137 mmol/L (ref 135–145)

## 2017-01-01 MED ORDER — HYDRALAZINE HCL 20 MG/ML IJ SOLN
10.0000 mg | Freq: Once | INTRAMUSCULAR | Status: AC
  Administered 2017-01-01: 10 mg via INTRAVENOUS
  Filled 2017-01-01: qty 1

## 2017-01-01 NOTE — Progress Notes (Signed)
Post Partum Day # 2 TSVD/PEC Subjective: Pt reports just getting over HA. Currently no visual changes but did have some yesterday. Tolerating diet. Voiding. Bottle feeding  Objective: Blood pressure (!) 156/96, pulse 89, temperature 98.5 F (36.9 C), temperature source Oral, resp. rate 16, height 5' 1.5" (1.562 m), weight 118 lb (53.5 kg), last menstrual period 05/01/2016, SpO2 100 %, unknown if currently breastfeeding.  Physical Exam:  General: no distress Lochia: appropriate Uterine Fundus: firm Incision: NA DVT Evaluation: No evidence of DVT seen on physical exam, no edema, nl DTR's   Recent Labs  12/30/16 1756  HGB 10.1*  HCT 29.8*    Assessment/Plan: PPD # 2 TSVD Severe PEC Hypokalemia No PNC  Off magnesium now. BP's still elevated. Second dose of Norvasc today. Will give one dose of  Apresoline now. Continue to follow BP's. K improving. Continue with replacement. SW consult pending. Continue with supportive care.   LOS: 2 days   Hermina StaggersMichael L Wendelin Reader 01/01/2017, 7:52 AM

## 2017-01-01 NOTE — Clinical Social Work Maternal (Signed)
CLINICAL SOCIAL WORK MATERNAL/CHILD NOTE  Patient Details  Name: Idalia D Winebarger MRN: [id number] Date of Birth: 06/30/1980  Date:  01/01/2017  Clinical Social Worker Initiating Note:  Adalee Kathan Boyd-Gilyard Date/ Time Initiated:  01/01/17/1510     Child's Name:  N'yRee Tyree-Rochelle   Legal Guardian:  Mother (FOB is Kamau Rochelle 06/14/1979)   Need for Interpreter:  None   Date of Referral:  12/30/16     Reason for Referral:  Current Substance Use/Substance Use During Pregnancy  (hx of THC use during pregnancy)   Referral Source:  Central Nursery   Address:  [address] [place]  [address]  Phone number:  [phone]   Household Members:  Self, Minor Children, Significant Other (MOB and family resides with MOB's grandmother.  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  With MOB's permission, CSW asked MOB's grandmother to leave the room in effort to meet with MOB in private. MOB was polite, inviting, and receptive to meeting with CSW.  During assessment MOB remained in the bed and infant was asleep in the bassinet.   CSW inquired  about MOB's limited/late PNC and MOB reported that MOB was not aware that MOB was pregnant.  MOB stated that MOB was taking birth control and pregnancy was confirmed late. However after pregnancy confirmation, MOB received PNC.  CSW informed MOB of hospital's policy regarding limited/late PNC and MOB was understanding.  CSW made MOB aware that infant was positive for THC and CSW will be making a report to [place] CPS.  CSW also informed MOB that CSW will  continue to monitor infant's CDS and will report the results to CPS; MOB was understanding.  MOB acknowledged CPS hx a few years ago when MOB was residing in [place].  MOB stated that MOB's case was investigated and closed after 45 days. CSW offered MOB SA resources and MOB declined. MOB reported MOB's last use of marijuana was about 2 weeks ago and MOB denied the use of all other illicit substance.   CSW provided PPD and SIDS education.  MOB denied PPD signs and symptoms with MOB's older children.  MOB reports have all necessary items for infant and feeling prepared to parent.    CSW thanked MOB for meeting with CSW and provided MOB with CSW's contact information.  There are no barriers to d/c.  CSW made a CPS report with [place] CPS.  CPS will follow-up with MOB within 72 hours.   CSW Plan/Description:  Child Protective Service Report , Information/Referral to Community Resources , Patient/Family Education , No Further Intervention Required/No Barriers to Discharge (CSW will report CDS   results to [place] CPS.)   Kashia Brossard Boyd-Gilyard, MSW, LCSW Clinical Social Work [phone]  Froylan Hobby D BOYD-GILYARD, LCSW 01/01/2017, 3:17 PM 

## 2017-01-02 LAB — BASIC METABOLIC PANEL
ANION GAP: 8 (ref 5–15)
BUN: 6 mg/dL (ref 6–20)
CO2: 23 mmol/L (ref 22–32)
Calcium: 9.3 mg/dL (ref 8.9–10.3)
Chloride: 106 mmol/L (ref 101–111)
Creatinine, Ser: 0.56 mg/dL (ref 0.44–1.00)
GFR calc Af Amer: >60 mL/min (ref >60)
GFR calc non Af Amer: >60 mL/min (ref >60)
GLUCOSE: 80 mg/dL (ref 65–99)
POTASSIUM: 3.8 mmol/L (ref 3.5–5.1)
Sodium: 137 mmol/L (ref 135–145)

## 2017-01-02 MED ORDER — TRIAMTERENE-HCTZ 37.5-25 MG PO TABS
1.0000 | ORAL_TABLET | Freq: Every day | ORAL | Status: DC
  Filled 2017-01-02 (×2): qty 1

## 2017-01-02 NOTE — Progress Notes (Signed)
Post Partum Day # 3 TSVD/PEC Subjective: Pt without complaints this morning. Denies HA and visual changes. Ambulating and voiding without problems. Tolerating diet. Bottle feeding.   Objective: Blood pressure (!) 148/99, pulse (!) 101, temperature 98.9 F (37.2 C), temperature source Oral, resp. rate 18, height 5' 1.5" (1.562 m), weight 118 lb (53.5 kg), last menstrual period 05/01/2016, SpO2 99 %, unknown if currently breastfeeding.  Physical Exam:  General: no distress Lochia: appropriate Uterine Fundus: firm Incision: NA DVT Evaluation: No evidence of DVT seen on physical exam, DTR's nl, no edema   Recent Labs  12/30/16 1756  HGB 10.1*  HCT 29.8*    Assessment/Plan: PPD # 3 TSVD PEC Hypokalemia No PNC  BP's remain elevated despite Norvasc. Will add Maxzide. Repeat labs. K improving. Appreciate SW consult, do barriers to discharge. Continue with supportive care   LOS: 3 days   Hermina StaggersMichael L Dimitria Ketchum 01/02/2017, 7:26 AM

## 2017-01-02 NOTE — Progress Notes (Signed)
Patients newborn being discharged, patient leaving AMA, has signed paper, been provided education on rationale for needing to stay and Faculty Provider Constant advised.

## 2017-01-14 NOTE — Discharge Summary (Signed)
Obstetric Discharge Summary Reason for Admission: onset of labor Prenatal Procedures: none Intrapartum Procedures: spontaneous vaginal delivery Postpartum Procedures: PEC Complications-Operative and Postpartum: none Hemoglobin  Date Value Ref Range Status  12/30/2016 10.1 (L) 12.0 - 15.0 g/dL Final  76/81/1572 9.6 (L) 11.1 - 15.9 g/dL Final   HCT  Date Value Ref Range Status  12/30/2016 29.8 (L) 36.0 - 46.0 % Final   Hematocrit  Date Value Ref Range Status  11/05/2016 28.5 (L) 34.0 - 46.6 % Final  Hospital course: Pt was admitted in active labor. Developed severe PEC. Started on Magnesium. TSVD on 85/518 without complications see Delivery Note for additional information. Pt received magnesium x 24 hrs. BP remained elevated and was started on medication for BP control. Second agent added. Hypokalemia noted as well and was corrected. Pt left AMA on PPD # 3 without discharge instructions or medications.  Physical Exam:  General: alert Lochia: appropriate Uterine Fundus: firm Incision: healing well DVT Evaluation: No evidence of DVT seen on physical exam.  Discharge Diagnoses: Term Pregnancy-delivered, Severe PEC  Discharge Information: Date: 01/14/2017 Activity: pelvic rest Diet: routine Medications: None Condition: stable Instructions: refer to practice specific booklet Discharge to: home   Newborn Data: Live born female  Birth Weight: 6 lb 13.2 oz (3096 g) APGAR: 8, 9  Home with mother.  [name] [name] 01/14/2017, 10:05 AM

## 2017-08-30 IMAGING — US US MFM OB COMP +14 WKS
1 series · 14 of 28 positions shown · non-contrast
Comparison: none

[Series 1: us mfm ob comp +14 wks · 78 acquisitions, 14 frames shown]
[im 3/78]
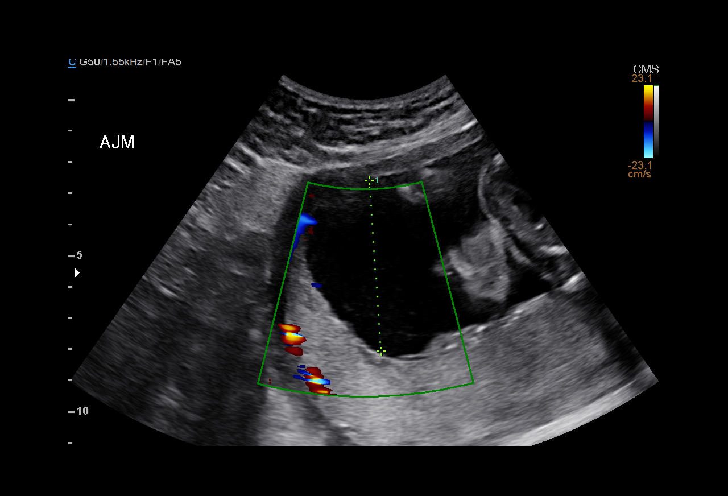
[im 9/78]
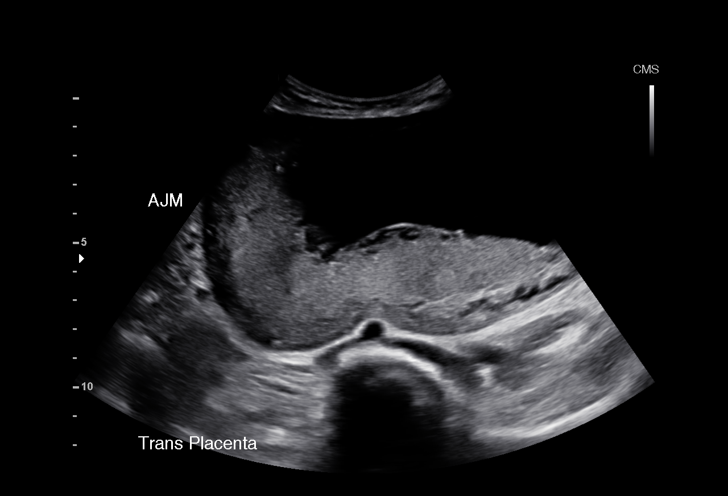
[im 15/78]
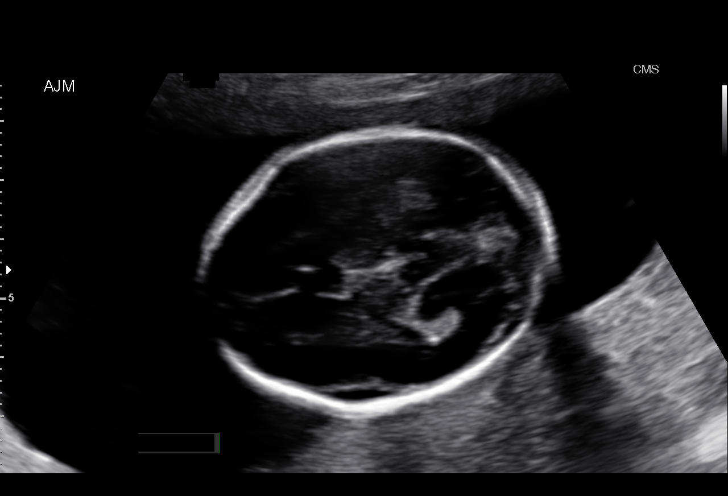
[im 20/78]
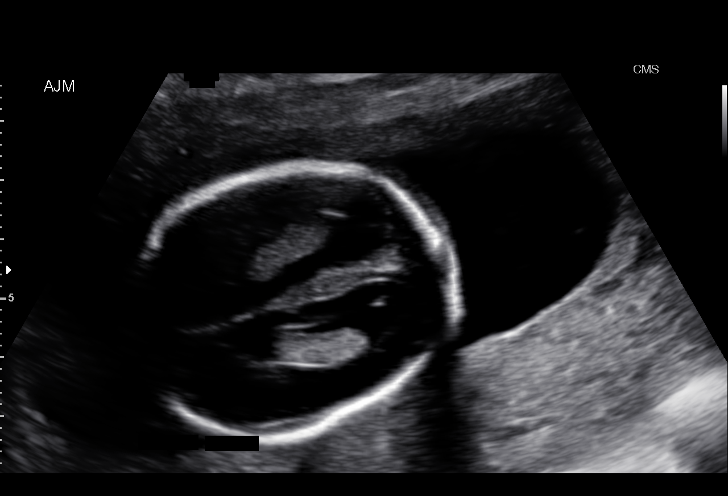
[im 26/78]
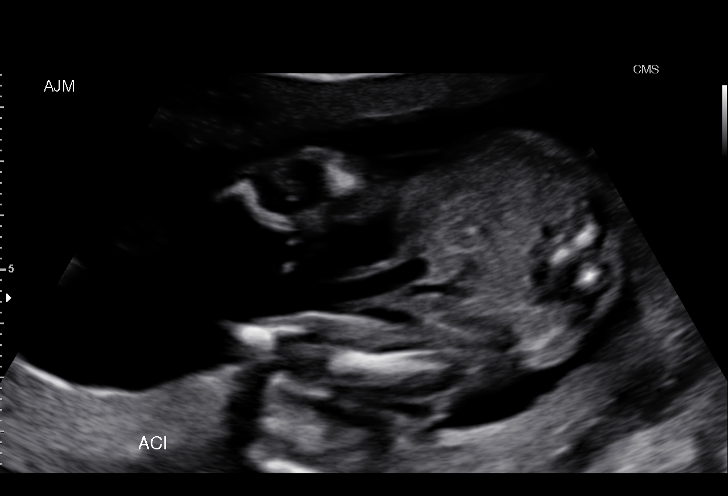
[im 32/78]
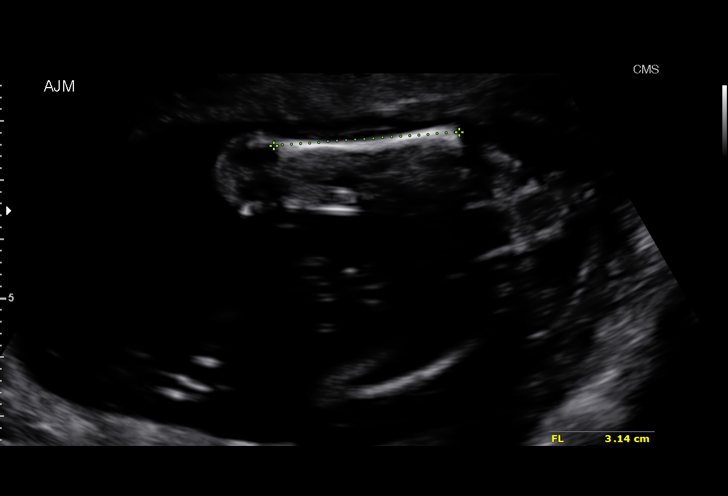
[im 38/78]
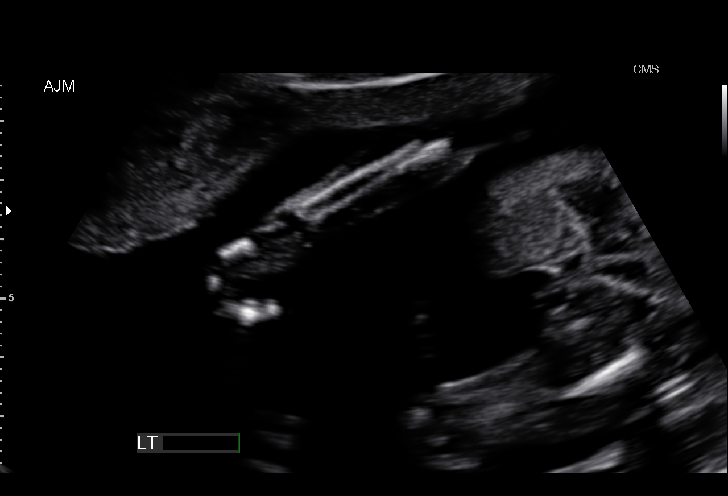
[im 43/78]
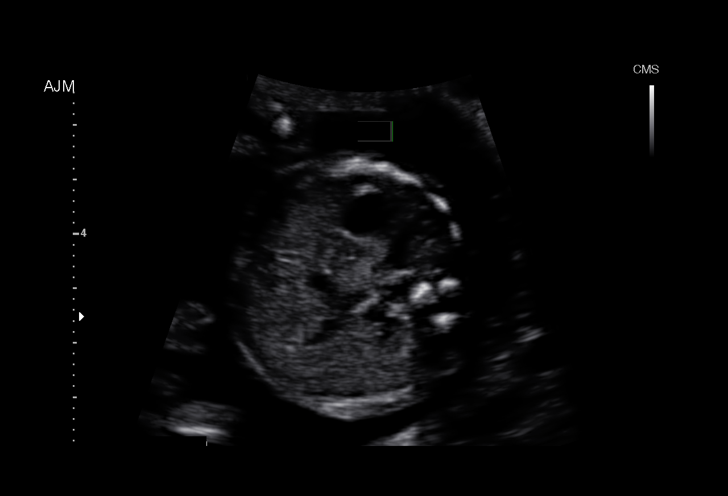
[im 49/78]
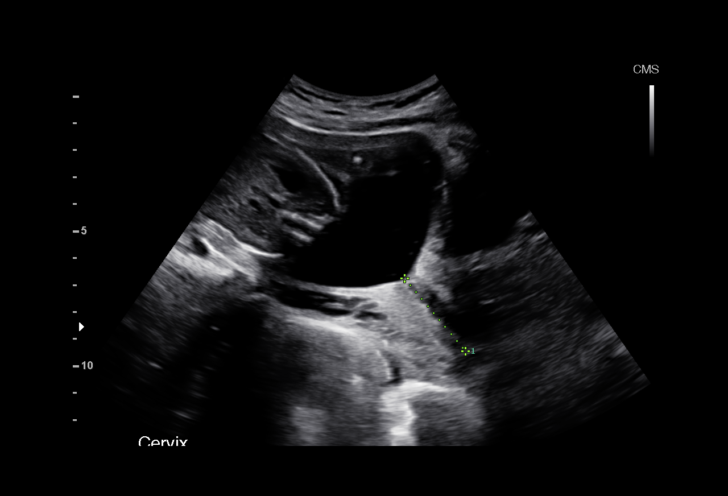
[im 55/78]
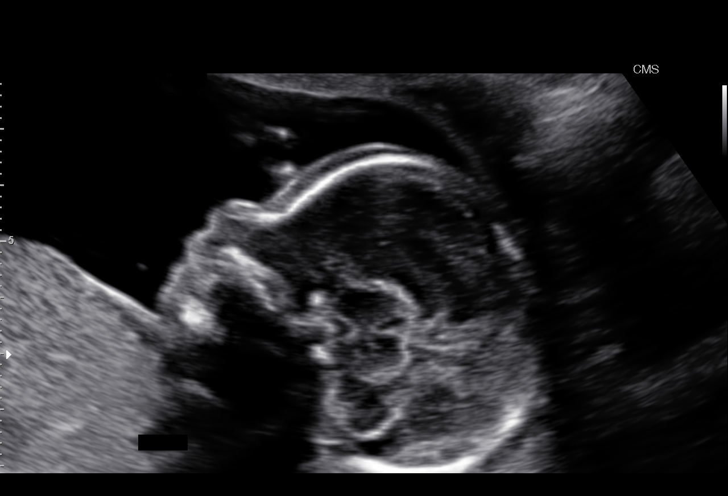
[im 60/78]
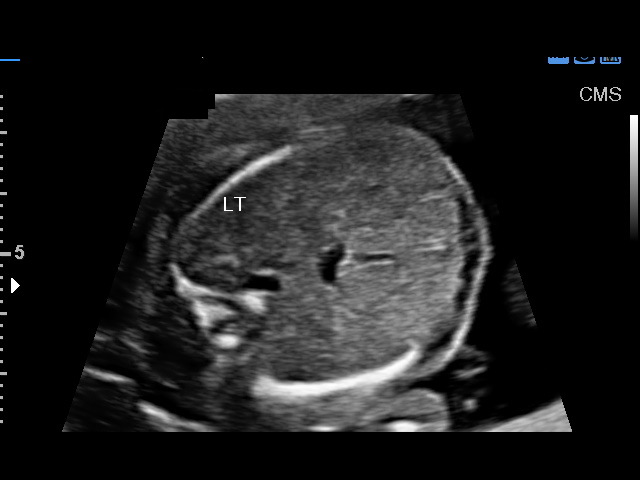
[im 66/78]
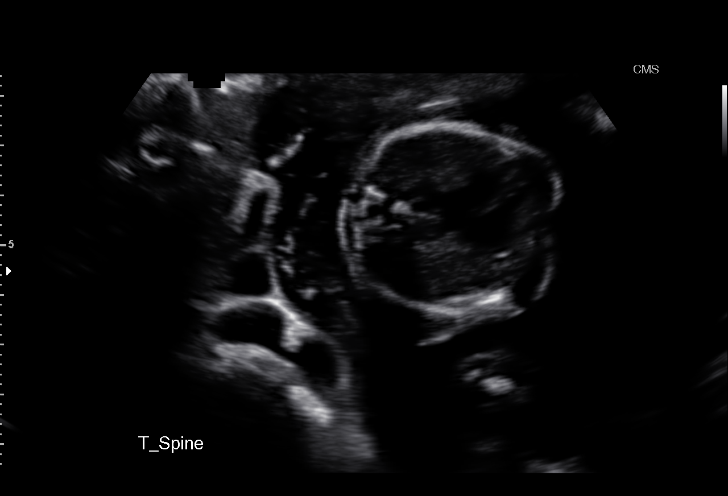
[im 72/78]
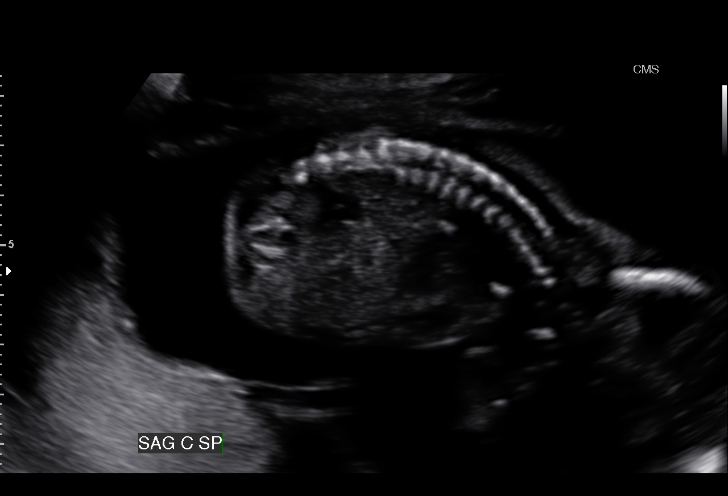
[im 78/78]
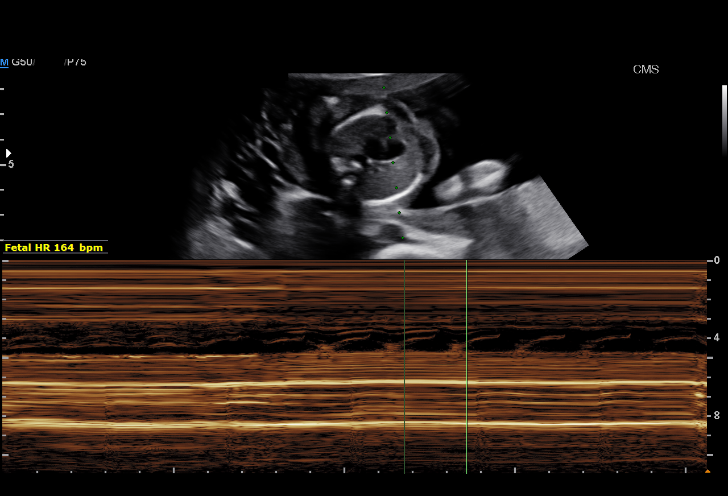

[14 of 28 positions shown; findings below may reference images not displayed]

OBSTETRICS REPORT
(Signed Final 04/17/2015 [DATE])

Service(s) Provided

Indications

Basic anatomic survey                                 Z36
20 weeks gestation of pregnancy
Fetal Evaluation

Num Of Fetuses:    1
Fetal Heart Rate:  164                          bpm
Cardiac Activity:  Observed
Presentation:      Transverse, head to
maternal left
Placenta:          Posterior, above cervical
os
P. Cord Insertion: Visualized, central

Amniotic Fluid
AFI FV:      Subjectively within normal limits
Larg Pckt:   5.48  cm
Biometry

BPD:     45.8   mm    G. Age:  19w 6d                CI:        71.06    70 - 86
FL/HC:       17.9   16.8 -
19.8
HC:     173.1   mm    G. Age:  19w 6d       30   %   HC/AC:       1.11   1.09 -
1.39
AC:     155.9   mm    G. Age:  20w 6d       64   %   FL/BPD:
FL:      30.9   mm    G. Age:  19w 4d       23   %   FL/AC:       19.8   20 - 24
HUM:     30.2   mm    G. Age:  20w 0d       48   %
CER:     20.7   mm    G. Age:  19w 5d       40   %

Est. FW:     336   gm    0 lb 12 oz     50  %
Gestational Age

LMP:           19w 3d        Date:  11/30/14                 EDD:    09/06/15
U/S Today:     20w 0d                                        EDD:    09/02/15
Best:          20w 1d     Det. By:  Early Ultrasound         EDD:    09/01/15
(01/28/15)
Anatomy
Cranium:          Appears normal         Aortic Arch:      Appears normal
Fetal Cavum:      Appears normal         Ductal Arch:      Appears normal
Ventricles:       Appears normal         Diaphragm:        Appears normal
Choroid Plexus:   Appears normal         Stomach:          Appears normal, left
sided
Cerebellum:       Appears normal         Abdomen:          Appears normal
Posterior Fossa:  Appears normal         Abdominal Wall:   Appears nml (cord
insert, abd wall)
Nuchal Fold:      Appears normal         Cord Vessels:     Appears normal (3
vessel cord)
Face:             Appears normal         Kidneys:          Appear normal
(orbits and profile)
Lips:             Appears normal         Bladder:          Appears normal
Heart:            Appears normal         Spine:            Appears normal
(4CH, axis, and
situs)
RVOT:             Appears normal         Lower             Appears normal
Extremities:
LVOT:             Appears normal         Upper             Appears normal
Extremities:

Other:  Fetus appears to be a female. Heels and 5th digit visualized. Nasal
bone visualized.
Targeted Anatomy

Fetal Central Nervous System
Cisterna Magna:
Cervix Uterus Adnexa

Cervical Length:    3.5       cm

Cervix:       Normal appearance by transabdominal scan.
Uterus:       No abnormality visualized.
Cul De Sac:   No free fluid seen.
Left Ovary:    Within normal limits.
Right Ovary:   Within normal limits.

Adnexa:     No abnormality visualized.
Impression

SIUP at 20+1 weeks
Normal detailed fetal anatomy
Markers of aneuploidy: none
Normal amniotic fluid volume
Measurements consistent with prior US
Recommendations

Follow-up as clinically indicated
